# Patient Record
Sex: Female | Born: 1985 | Race: White | Hispanic: No | Marital: Married | Smoking: Current some day smoker
Health system: Southern US, Community
[De-identification: ages and names within clinical notes are randomized; demographics above are authoritative.]

## PROBLEM LIST (undated history)

## (undated) DIAGNOSIS — N200 Calculus of kidney: Secondary | ICD-10-CM

## (undated) DIAGNOSIS — F329 Major depressive disorder, single episode, unspecified: Secondary | ICD-10-CM

## (undated) DIAGNOSIS — F419 Anxiety disorder, unspecified: Secondary | ICD-10-CM

## (undated) DIAGNOSIS — E282 Polycystic ovarian syndrome: Secondary | ICD-10-CM

## (undated) DIAGNOSIS — F32A Depression, unspecified: Secondary | ICD-10-CM

## (undated) HISTORY — DX: Morbid (severe) obesity due to excess calories: E66.01

## (undated) HISTORY — DX: Major depressive disorder, single episode, unspecified: F32.9

## (undated) HISTORY — PX: KIDNEY STONE SURGERY: SHX686

## (undated) HISTORY — DX: Depression, unspecified: F32.A

## (undated) HISTORY — DX: Anxiety disorder, unspecified: F41.9

---
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
## 2007-01-23 ENCOUNTER — Encounter: Payer: Self-pay | Admitting: Family Medicine

## 2007-01-23 DIAGNOSIS — E282 Polycystic ovarian syndrome: Secondary | ICD-10-CM | POA: Insufficient documentation

## 2007-01-23 DIAGNOSIS — F341 Dysthymic disorder: Secondary | ICD-10-CM

## 2007-01-24 ENCOUNTER — Ambulatory Visit: Payer: Self-pay | Admitting: Family Medicine

## 2007-03-29 ENCOUNTER — Ambulatory Visit: Payer: Self-pay | Admitting: Family Medicine

## 2007-05-01 ENCOUNTER — Telehealth: Payer: Self-pay | Admitting: Family Medicine

## 2007-05-11 ENCOUNTER — Ambulatory Visit: Payer: Self-pay | Admitting: Family Medicine

## 2007-06-15 ENCOUNTER — Telehealth (INDEPENDENT_AMBULATORY_CARE_PROVIDER_SITE_OTHER): Payer: Self-pay | Admitting: *Deleted

## 2007-06-17 ENCOUNTER — Inpatient Hospital Stay (HOSPITAL_COMMUNITY): Admission: AD | Admit: 2007-06-17 | Discharge: 2007-06-17 | Payer: Self-pay | Admitting: Gynecology

## 2007-07-24 ENCOUNTER — Ambulatory Visit: Payer: Self-pay | Admitting: Family Medicine

## 2007-07-24 DIAGNOSIS — H6690 Otitis media, unspecified, unspecified ear: Secondary | ICD-10-CM | POA: Insufficient documentation

## 2007-08-25 ENCOUNTER — Telehealth: Payer: Self-pay | Admitting: Family Medicine

## 2007-08-31 ENCOUNTER — Ambulatory Visit: Payer: Self-pay | Admitting: Family Medicine

## 2007-09-01 ENCOUNTER — Telehealth (INDEPENDENT_AMBULATORY_CARE_PROVIDER_SITE_OTHER): Payer: Self-pay | Admitting: *Deleted

## 2007-09-06 ENCOUNTER — Encounter: Admission: RE | Admit: 2007-09-06 | Discharge: 2007-09-06 | Payer: Self-pay | Admitting: Obstetrics & Gynecology

## 2007-09-22 ENCOUNTER — Ambulatory Visit: Payer: Self-pay | Admitting: Family Medicine

## 2007-09-22 DIAGNOSIS — J069 Acute upper respiratory infection, unspecified: Secondary | ICD-10-CM | POA: Insufficient documentation

## 2007-09-22 DIAGNOSIS — R071 Chest pain on breathing: Secondary | ICD-10-CM

## 2007-11-16 ENCOUNTER — Telehealth: Payer: Self-pay | Admitting: Family Medicine

## 2008-06-05 ENCOUNTER — Ambulatory Visit: Payer: Self-pay | Admitting: Family Medicine

## 2008-07-08 ENCOUNTER — Telehealth: Payer: Self-pay | Admitting: Family Medicine

## 2008-07-11 ENCOUNTER — Ambulatory Visit: Payer: Self-pay | Admitting: Family Medicine

## 2008-07-22 ENCOUNTER — Telehealth: Payer: Self-pay | Admitting: Family Medicine

## 2008-07-23 ENCOUNTER — Telehealth: Payer: Self-pay | Admitting: Family Medicine

## 2008-09-18 ENCOUNTER — Telehealth (INDEPENDENT_AMBULATORY_CARE_PROVIDER_SITE_OTHER): Payer: Self-pay | Admitting: *Deleted

## 2008-10-10 ENCOUNTER — Telehealth: Payer: Self-pay | Admitting: Family Medicine

## 2008-11-22 ENCOUNTER — Telehealth: Payer: Self-pay | Admitting: Family Medicine

## 2008-12-06 ENCOUNTER — Telehealth: Payer: Self-pay | Admitting: Family Medicine

## 2008-12-07 ENCOUNTER — Ambulatory Visit: Payer: Self-pay | Admitting: Family Medicine

## 2008-12-27 ENCOUNTER — Telehealth: Payer: Self-pay | Admitting: Family Medicine

## 2009-01-29 ENCOUNTER — Telehealth: Payer: Self-pay | Admitting: Family Medicine

## 2009-02-26 ENCOUNTER — Telehealth: Payer: Self-pay | Admitting: Family Medicine

## 2009-03-18 ENCOUNTER — Telehealth: Payer: Self-pay | Admitting: Family Medicine

## 2009-04-11 ENCOUNTER — Ambulatory Visit: Payer: Self-pay | Admitting: Family Medicine

## 2009-04-11 DIAGNOSIS — M79609 Pain in unspecified limb: Secondary | ICD-10-CM | POA: Insufficient documentation

## 2009-04-14 ENCOUNTER — Telehealth: Payer: Self-pay | Admitting: Family Medicine

## 2009-04-15 ENCOUNTER — Ambulatory Visit: Payer: Self-pay | Admitting: Family Medicine

## 2009-04-17 ENCOUNTER — Telehealth: Payer: Self-pay | Admitting: Family Medicine

## 2009-04-21 ENCOUNTER — Telehealth (INDEPENDENT_AMBULATORY_CARE_PROVIDER_SITE_OTHER): Payer: Self-pay | Admitting: *Deleted

## 2009-04-21 ENCOUNTER — Encounter (INDEPENDENT_AMBULATORY_CARE_PROVIDER_SITE_OTHER): Payer: Self-pay | Admitting: *Deleted

## 2009-04-22 ENCOUNTER — Encounter: Payer: Self-pay | Admitting: Family Medicine

## 2009-04-22 ENCOUNTER — Telehealth: Payer: Self-pay | Admitting: Family Medicine

## 2009-05-13 ENCOUNTER — Ambulatory Visit: Payer: Self-pay | Admitting: Family Medicine

## 2009-05-30 ENCOUNTER — Encounter: Payer: Self-pay | Admitting: Family Medicine

## 2009-06-06 ENCOUNTER — Telehealth: Payer: Self-pay | Admitting: Family Medicine

## 2009-09-07 ENCOUNTER — Emergency Department: Payer: Self-pay | Admitting: Emergency Medicine

## 2009-09-09 ENCOUNTER — Emergency Department: Payer: Self-pay | Admitting: Emergency Medicine

## 2009-09-12 ENCOUNTER — Ambulatory Visit: Payer: Self-pay | Admitting: Urology

## 2009-09-16 ENCOUNTER — Ambulatory Visit: Payer: Self-pay | Admitting: Urology

## 2009-12-11 ENCOUNTER — Encounter: Payer: Self-pay | Admitting: Family Medicine

## 2010-03-19 ENCOUNTER — Ambulatory Visit: Payer: Self-pay | Admitting: Urology

## 2010-11-25 NOTE — Letter (Signed)
Summary: [facility]   Imported By: Lanelle Bal 12/15/2009 14:14:20  _____________________________________________________________________  External Attachment:    Type:   Image     Comment:   External Document

## 2011-04-20 ENCOUNTER — Encounter: Payer: Self-pay | Admitting: Family Medicine

## 2011-04-21 ENCOUNTER — Ambulatory Visit: Payer: Self-pay | Admitting: Family Medicine

## 2011-08-06 LAB — CBC
HCT: 43.7
Hemoglobin: 15.1 — ABNORMAL HIGH
MCHC: 34.7
Platelets: 291
RDW: 12.9

## 2011-08-06 LAB — URINALYSIS, ROUTINE W REFLEX MICROSCOPIC
Bilirubin Urine: NEGATIVE
Glucose, UA: NEGATIVE
Ketones, ur: NEGATIVE
Nitrite: NEGATIVE
Specific Gravity, Urine: >1.03 — ABNORMAL HIGH
pH: 5.5

## 2011-08-06 LAB — WET PREP, GENITAL
Trich, Wet Prep: NONE SEEN
Yeast Wet Prep HPF POC: NONE SEEN

## 2011-08-06 LAB — SAMPLE TO BLOOD BANK

## 2011-08-06 LAB — GC/CHLAMYDIA PROBE AMP, GENITAL: Chlamydia, DNA Probe: NEGATIVE

## 2011-08-06 LAB — POCT PREGNANCY, URINE: Operator id: 140111

## 2011-08-06 LAB — URINE MICROSCOPIC-ADD ON

## 2012-03-14 ENCOUNTER — Ambulatory Visit (INDEPENDENT_AMBULATORY_CARE_PROVIDER_SITE_OTHER): Payer: BC Managed Care – PPO | Admitting: Family Medicine

## 2012-03-14 ENCOUNTER — Encounter: Payer: Self-pay | Admitting: Family Medicine

## 2012-03-14 VITALS — BP 104/80 | HR 60 | Temp 98.1°F | Wt 245.0 lb

## 2012-03-14 DIAGNOSIS — J029 Acute pharyngitis, unspecified: Secondary | ICD-10-CM

## 2012-03-14 NOTE — Progress Notes (Signed)
Addended by: Eliezer Bottom on: 03/14/2012 09:38 AM   Modules accepted: Orders

## 2012-03-14 NOTE — Progress Notes (Signed)
SUBJECTIVE: 26 y.o. female with sore throat, myalgias, swollen glands, headache and fever for 7 days.  Friends dad was diagnosed with strep throat.  No history of rheumatic fever. Other symptoms: congestion.  Patient Active Problem List  Diagnoses  . POLYCYSTIC OVARIAN DISEASE  . ANXIETY DEPRESSION  . LOM  . URI  . ASTHMA, INTERMITTENT, MILD  . FOOT PAIN, LEFT  . CHEST WALL PAIN, ACUTE  . ALLERGY   Past Medical History  Diagnosis Date  . Anxiety   . Depression   . Morbid obesity    No past surgical history on file. History  Substance Use Topics  . Smoking status: Never Smoker   . Smokeless tobacco: Not on file  . Alcohol Use: Not on file   No family history on file. Allergies  Allergen Reactions  . Codeine Phosphate     REACTION: questionable  . Erythromycin Ethylsuccinate     REACTION: questionable   Current Outpatient Prescriptions on File Prior to Visit  Medication Sig Dispense Refill  . albuterol (PROVENTIL) 90 MCG/ACT inhaler Inhale 2 puffs into the lungs every 6 (six) hours as needed.        Marland Kitchen ibuprofen (ADVIL,MOTRIN) 200 MG tablet Take 200 mg by mouth. As needed for pain or fever      . norethindrone-ethinyl estradiol (LOESTRIN 1/20, 21,) 1-20 MG-MCG per tablet Take 1 tablet by mouth daily.         The PMH, PSH, Social History, Family History, Medications, and allergies have been reviewed in [facility], and have been updated if relevant.  OBJECTIVE:  BP 104/80  Pulse 60  Temp(Src) 98.1 F (36.7 C) (Oral)  Wt 245 lb (111.131 kg)  Vitals as noted above. Appears alert, well appearing, and in no distress. Ears: bilateral TM's and external ear canals normal Oropharynx: erythematous Neck: supple, no significant adenopathy Lungs: clear to auscultation, no wheezes, rales or rhonchi, symmetric air entry Rapid Strep test is present  ASSESSMENT: Viral pharyngitis  PLAN: . Gargle, use acetaminophen or other OTC analgesic. Send throat swab for cx.

## 2012-07-30 ENCOUNTER — Encounter (HOSPITAL_COMMUNITY): Payer: Self-pay | Admitting: *Deleted

## 2012-07-30 ENCOUNTER — Emergency Department (HOSPITAL_COMMUNITY)
Admission: EM | Admit: 2012-07-30 | Discharge: 2012-07-30 | Disposition: A | Discharge status: Home / Self Care | Payer: BC Managed Care – PPO | Attending: Emergency Medicine | Admitting: Emergency Medicine

## 2012-07-30 DIAGNOSIS — K5289 Other specified noninfective gastroenteritis and colitis: Secondary | ICD-10-CM | POA: Insufficient documentation

## 2012-07-30 DIAGNOSIS — K529 Noninfective gastroenteritis and colitis, unspecified: Secondary | ICD-10-CM

## 2012-07-30 DIAGNOSIS — E669 Obesity, unspecified: Secondary | ICD-10-CM | POA: Insufficient documentation

## 2012-07-30 DIAGNOSIS — D72829 Elevated white blood cell count, unspecified: Secondary | ICD-10-CM | POA: Insufficient documentation

## 2012-07-30 DIAGNOSIS — R197 Diarrhea, unspecified: Secondary | ICD-10-CM

## 2012-07-30 HISTORY — DX: Calculus of kidney: N20.0

## 2012-07-30 HISTORY — DX: Polycystic ovarian syndrome: E28.2

## 2012-07-30 LAB — CBC
HCT: 44.9 % (ref 36.0–46.0)
Hemoglobin: 16.2 g/dL — ABNORMAL HIGH (ref 12.0–15.0)
Platelets: 246 10*3/uL (ref 150–400)
RDW: 13.4 % (ref 11.5–15.5)

## 2012-07-30 LAB — URINALYSIS, ROUTINE W REFLEX MICROSCOPIC
Bilirubin Urine: NEGATIVE
Glucose, UA: NEGATIVE mg/dL
Hgb urine dipstick: NEGATIVE
Ketones, ur: NEGATIVE mg/dL
Nitrite: NEGATIVE
Specific Gravity, Urine: 1.024 (ref 1.005–1.030)
Urobilinogen, UA: 0.2 mg/dL (ref 0.0–1.0)

## 2012-07-30 LAB — COMPREHENSIVE METABOLIC PANEL
AST: 15 U/L (ref 0–37)
Albumin: 4.4 g/dL (ref 3.5–5.2)
CO2: 23 mEq/L (ref 19–32)
Calcium: 9.4 mg/dL (ref 8.4–10.5)
Creatinine, Ser: 0.74 mg/dL (ref 0.50–1.10)
Glucose, Bld: 105 mg/dL — ABNORMAL HIGH (ref 70–99)
Potassium: 3.4 mEq/L — ABNORMAL LOW (ref 3.5–5.1)
Sodium: 138 mEq/L (ref 135–145)
Total Bilirubin: 0.5 mg/dL (ref 0.3–1.2)
Total Protein: 8.1 g/dL (ref 6.0–8.3)

## 2012-07-30 MED ORDER — SODIUM CHLORIDE 0.9 % IV BOLUS (SEPSIS)
1000.0000 mL | Freq: Once | INTRAVENOUS | Status: AC
  Administered 2012-07-30: 1000 mL via INTRAVENOUS

## 2012-07-30 MED ORDER — ONDANSETRON HCL 4 MG/2ML IJ SOLN
4.0000 mg | Freq: Once | INTRAMUSCULAR | Status: AC
  Administered 2012-07-30: 4 mg via INTRAVENOUS

## 2012-07-30 MED ORDER — ONDANSETRON HCL 4 MG/2ML IJ SOLN
4.0000 mg | Freq: Once | INTRAMUSCULAR | Status: AC
  Administered 2012-07-30: 4 mg via INTRAVENOUS
  Filled 2012-07-30: qty 2

## 2012-07-30 MED ORDER — ONDANSETRON HCL 4 MG/2ML IJ SOLN
INTRAMUSCULAR | Status: AC
  Administered 2012-07-30: 4 mg via INTRAVENOUS
  Filled 2012-07-30: qty 2

## 2012-07-30 MED ORDER — ONDANSETRON HCL 4 MG PO TABS: 4.0000 mg | ORAL_TABLET | Freq: Four times a day (QID) | ORAL | Status: DC

## 2012-07-30 MED ORDER — FENTANYL CITRATE 0.05 MG/ML IJ SOLN
50.0000 ug | Freq: Once | INTRAMUSCULAR | Status: AC
  Administered 2012-07-30: 50 ug via INTRAVENOUS
  Filled 2012-07-30: qty 2

## 2012-07-30 NOTE — ED Provider Notes (Cosign Needed)
History     CSN: [id number]  Arrival date & time 07/30/12  0345   First MD Initiated Contact with Patient 07/30/12 0430      Chief Complaint  Patient presents with  . Nausea  . Emesis    (Consider location/radiation/quality/duration/timing/severity/associated sxs/prior treatment) HPI 26 year old female presents to the emergency department with complaint of one day of nausea, dizziness. She reports she woke yesterday morning at 5 AM feeling nauseous and dizzy. [place]. She was able to eat lunch, but did not feel well during the day. She slept on the way back from the mountains and had a 3 hour nap upon arriving home. She had a peanut butter sandwich around 9:30. At 10:30 she vomited, reports she's forced her self vomit because her stomach wouldn't stop churning. She woke at 3 AM with acute perfuse nausea vomiting and diarrhea. She denies any fevers. She denies any abdominal pain aside from some muscle soreness from vomiting. She's had no urinary symptoms. No sick contacts, no unusual foods. No recent exotic travel  Past Medical History  Diagnosis Date  . Anxiety   . Depression   . Morbid obesity   . Polycystic ovarian disease   . Kidney stone     Past Surgical History  Procedure Date  . Kidney stone surgery     No family history on file.  History  Substance Use Topics  . Smoking status: Never Smoker   . Smokeless tobacco: Not on file  . Alcohol Use: Yes     occ    OB History    Grav Para Term Preterm Abortions TAB SAB Ect Mult Living                  Review of Systems  All other systems reviewed and are negative.    Allergies  Codeine phosphate and Erythromycin ethylsuccinate  Home Medications   Current Outpatient Rx  Name Route Sig Dispense Refill  . IBUPROFEN 200 MG PO TABS Oral Take 400-800 mg by mouth every 6 (six) hours as needed. As needed for pain or fever    . ALBUTEROL 90 MCG/ACT IN AERS Inhalation Inhale 2  puffs into the lungs every 6 (six) hours as needed.        BP 132/82  Pulse 109  Temp 98 F (36.7 C)  Resp 20  SpO2 98%  LMP 07/27/2012  Physical Exam  Nursing note and vitals reviewed. Constitutional: She is oriented to person, place, and time. She appears well-developed and well-nourished.       Obese, uncomfortable.  HENT:  Head: Normocephalic and atraumatic.  Nose: Nose normal.  Mouth/Throat: Oropharynx is clear and moist.  Eyes: Conjunctivae normal and EOM are normal. Pupils are equal, round, and reactive to light.  Neck: Normal range of motion. Neck supple. No JVD present. No tracheal deviation present. No thyromegaly present.  Cardiovascular: Regular rhythm, normal heart sounds and intact distal pulses.  Exam reveals no gallop and no friction rub.   No murmur heard.      Mild tachycardia noted  Pulmonary/Chest: Effort normal and breath sounds normal. No stridor. No respiratory distress. She has no wheezes. She has no rales. She exhibits no tenderness.  Abdominal: Soft. She exhibits no distension and no mass. There is no tenderness. There is no rebound and no guarding.       Hyperactive bowel sounds. Abdomen is nontender, but pushing on abdomen causes increased nausea  Musculoskeletal: Normal  range of motion. She exhibits no edema and no tenderness.  Lymphadenopathy:    She has no cervical adenopathy.  Neurological: She is alert and oriented to person, place, and time. She exhibits normal muscle tone. Coordination normal.  Skin: Skin is warm and dry. No rash noted. No erythema. No pallor.  Psychiatric: She has a normal mood and affect. Her behavior is normal. Judgment and thought content normal.    ED Course  Procedures (including critical care time)  Labs Reviewed  CBC - Abnormal; Notable for the following:    WBC 21.0 (*)     RBC 5.41 (*)     Hemoglobin 16.2 (*)     MCHC 36.1 (*)     All other components within normal limits  COMPREHENSIVE METABOLIC PANEL -  Abnormal; Notable for the following:    Potassium 3.4 (*)     Glucose, Bld 105 (*)     All other components within normal limits  URINALYSIS, ROUTINE W REFLEX MICROSCOPIC  PREGNANCY, URINE   No results found.   1. Nausea vomiting and diarrhea   2. Leukocytosis       MDM  26 year old female with nausea vomiting and diarrhea since 3 AM with malaise and the day prior. No fever here, initial tachycardia resolved with fluids. Patient is noted to have significant leukocytosis, suspect the marginal ulceration from vomiting. Abdomen rechecked after an hour, still nonsurgical without rebound or guarding or pain. Patient reports she felt very dizzy with standing. Will continue IV fluids, serial abdominal exams. She has not yet been able to urinate await results.        [name], MD 07/30/12 [phone]  7:23 AM Care passed to Dr Effie Shy awaiting improvement in nausea, dizziness.    [name], MD 07/30/12 [phone]

## 2012-07-30 NOTE — ED Notes (Signed)
Pt c/o feeling dizzy; weak; vomiting started at 2130

## 2012-07-30 NOTE — ED Notes (Signed)
Patient tolerating PO liquids and crackers.

## 2012-10-26 ENCOUNTER — Encounter: Payer: Self-pay | Admitting: Family Medicine

## 2012-10-26 ENCOUNTER — Ambulatory Visit (INDEPENDENT_AMBULATORY_CARE_PROVIDER_SITE_OTHER): Payer: BC Managed Care – PPO | Admitting: Family Medicine

## 2012-10-26 VITALS — BP 110/70 | HR 110 | Temp 98.9°F | Ht 63.0 in | Wt 231.0 lb

## 2012-10-26 DIAGNOSIS — J111 Influenza due to unidentified influenza virus with other respiratory manifestations: Secondary | ICD-10-CM

## 2012-10-26 DIAGNOSIS — A084 Viral intestinal infection, unspecified: Secondary | ICD-10-CM | POA: Insufficient documentation

## 2012-10-26 MED ORDER — ONDANSETRON HCL 4 MG PO TABS: 4.0000 mg | ORAL_TABLET | Freq: Four times a day (QID) | ORAL | Status: DC

## 2012-10-26 NOTE — Progress Notes (Signed)
  Subjective:    Patient ID: Jamie Mcknight, female    DOB: [birth year], 27 y.o.   MRN: [id number]  HPI Comments: Exposed to flu from father  Has been drinking gatorade today. No emesis today.  Fever  This is a new problem. The current episode started yesterday (sudden onset). Maximum temperature: subjective temp. Associated symptoms include headaches, nausea and vomiting. Pertinent negatives include no congestion, coughing, diarrhea, ear pain, sore throat or wheezing. Treatments tried: zofran. The treatment provided mild relief.      Review of Systems  Constitutional: Positive for fever.  HENT: Negative for ear pain, congestion and sore throat.   Respiratory: Negative for cough and wheezing.   Gastrointestinal: Positive for nausea and vomiting. Negative for diarrhea.  Neurological: Positive for headaches.       Objective:   Physical Exam  Constitutional: Vital signs are normal. She appears well-developed and well-nourished. She is cooperative.  Non-toxic appearance. She does not appear ill. No distress.       Fatigued appearing  HENT:  Head: Normocephalic.  Right Ear: Hearing, tympanic membrane, external ear and ear canal normal. Tympanic membrane is not erythematous, not retracted and not bulging.  Left Ear: Hearing, tympanic membrane, external ear and ear canal normal. Tympanic membrane is not erythematous, not retracted and not bulging.  Nose: No mucosal edema or rhinorrhea. Right sinus exhibits no maxillary sinus tenderness and no frontal sinus tenderness. Left sinus exhibits no maxillary sinus tenderness and no frontal sinus tenderness.  Mouth/Throat: Uvula is midline, oropharynx is clear and moist and mucous membranes are normal.  Eyes: Conjunctivae normal, EOM and lids are normal. Pupils are equal, round, and reactive to light. No foreign bodies found.  Neck: Trachea normal and normal range of motion. Neck supple. Carotid bruit is not present. No mass and no thyromegaly present.    Cardiovascular: Normal rate, regular rhythm, S1 normal, S2 normal, normal heart sounds, intact distal pulses and normal pulses.  Exam reveals no gallop and no friction rub.   No murmur heard. Pulmonary/Chest: Effort normal and breath sounds normal. Not tachypneic. No respiratory distress. She has no decreased breath sounds. She has no wheezes. She has no rhonchi. She has no rales.  Abdominal: Soft. Normal appearance and bowel sounds are normal. There is no tenderness.  Neurological: She is alert.  Skin: Skin is warm, dry and intact. No rash noted.  Psychiatric: Her speech is normal and behavior is normal. Judgment normal. Her mood appears not anxious. Cognition and memory are normal. She does not exhibit a depressed mood.          Assessment & Plan:

## 2012-10-26 NOTE — Patient Instructions (Addendum)
Start tylenol for pain and fever. Push fluids. Rest. Stay out out work until 24 hours after fever resolved off tylenol.  Call if you change your mind about tamiflu afer talking with your Dad.

## 2012-10-26 NOTE — Assessment & Plan Note (Addendum)
Discussed symptomatic care.  Hydration, rest. Call if SOB, cough worsening or prolongued fever.  Given father with flu she possibly has a variant of this. Reviewed flu timeline. She has no health problems that put her at risk for complications , so we decided against use of tamiflu. Pt agreed. Discussed family prophylaxis, her roomate will consider tamiflu prophylaxis. She was advised to not return to work until 24 hour after fever resolved on no antipyretics.

## 2014-11-27 ENCOUNTER — Other Ambulatory Visit: Payer: Self-pay | Admitting: Internal Medicine

## 2014-11-27 DIAGNOSIS — M25562 Pain in left knee: Secondary | ICD-10-CM

## 2014-12-02 ENCOUNTER — Other Ambulatory Visit: Payer: Self-pay

## 2014-12-04 ENCOUNTER — Ambulatory Visit
Admission: RE | Admit: 2014-12-04 | Discharge: 2014-12-04 | Disposition: A | Discharge status: Home / Self Care | Payer: No Typology Code available for payment source | Source: Ambulatory Visit | Attending: Internal Medicine | Admitting: Internal Medicine

## 2014-12-04 DIAGNOSIS — M25562 Pain in left knee: Secondary | ICD-10-CM

## 2016-10-29 ENCOUNTER — Emergency Department (HOSPITAL_COMMUNITY)
Admission: EM | Admit: 2016-10-29 | Discharge: 2016-10-29 | Disposition: A | Discharge status: Home / Self Care | Payer: BC Managed Care – PPO | Attending: Emergency Medicine | Admitting: Emergency Medicine

## 2016-10-29 ENCOUNTER — Encounter (HOSPITAL_COMMUNITY): Payer: Self-pay

## 2016-10-29 DIAGNOSIS — R69 Illness, unspecified: Secondary | ICD-10-CM

## 2016-10-29 DIAGNOSIS — J111 Influenza due to unidentified influenza virus with other respiratory manifestations: Secondary | ICD-10-CM | POA: Insufficient documentation

## 2016-10-29 DIAGNOSIS — R112 Nausea with vomiting, unspecified: Secondary | ICD-10-CM | POA: Diagnosis present

## 2016-10-29 LAB — CBC
HCT: 43.1 % (ref 36.0–46.0)
Hemoglobin: 14.6 g/dL (ref 12.0–15.0)
MCH: 27.8 pg (ref 26.0–34.0)
MCHC: 33.9 g/dL (ref 30.0–36.0)
MCV: 82.1 fL (ref 78.0–100.0)
PLATELETS: 257 10*3/uL (ref 150–400)
RBC: 5.25 MIL/uL — ABNORMAL HIGH (ref 3.87–5.11)
RDW: 13.9 % (ref 11.5–15.5)
WBC: 17.1 10*3/uL — AB (ref 4.0–10.5)

## 2016-10-29 LAB — COMPREHENSIVE METABOLIC PANEL
ALT: 17 U/L (ref 14–54)
ANION GAP: 13 (ref 5–15)
AST: 23 U/L (ref 15–41)
Albumin: 4.3 g/dL (ref 3.5–5.0)
Alkaline Phosphatase: 97 U/L (ref 38–126)
BUN: 18 mg/dL (ref 6–20)
CALCIUM: 9.2 mg/dL (ref 8.9–10.3)
CHLORIDE: 106 mmol/L (ref 101–111)
CO2: 20 mmol/L — ABNORMAL LOW (ref 22–32)
CREATININE: 0.85 mg/dL (ref 0.44–1.00)
Glucose, Bld: 148 mg/dL — ABNORMAL HIGH (ref 65–99)
Potassium: 3.7 mmol/L (ref 3.5–5.1)
Sodium: 139 mmol/L (ref 135–145)
Total Bilirubin: 0.3 mg/dL (ref 0.3–1.2)
Total Protein: 8 g/dL (ref 6.5–8.1)

## 2016-10-29 LAB — I-STAT BETA HCG BLOOD, ED (MC, WL, AP ONLY)

## 2016-10-29 LAB — LIPASE, BLOOD: LIPASE: 20 U/L (ref 11–51)

## 2016-10-29 MED ORDER — SODIUM CHLORIDE 0.9 % IV BOLUS (SEPSIS)
1000.0000 mL | Freq: Once | INTRAVENOUS | Status: AC
  Administered 2016-10-29: 1000 mL via INTRAVENOUS

## 2016-10-29 MED ORDER — ONDANSETRON 4 MG PO TBDP: 4.0000 mg | ORAL_TABLET | Freq: Three times a day (TID) | ORAL | 0 refills | Status: DC | PRN

## 2016-10-29 MED ORDER — KETOROLAC TROMETHAMINE 30 MG/ML IJ SOLN
30.0000 mg | Freq: Once | INTRAMUSCULAR | Status: AC
  Administered 2016-10-29: 30 mg via INTRAVENOUS
  Filled 2016-10-29: qty 1

## 2016-10-29 MED ORDER — ONDANSETRON HCL 4 MG/2ML IJ SOLN
4.0000 mg | Freq: Once | INTRAMUSCULAR | Status: AC
  Administered 2016-10-29: 4 mg via INTRAVENOUS
  Filled 2016-10-29: qty 2

## 2016-10-29 MED ORDER — SODIUM CHLORIDE 0.9 % IV SOLN
Freq: Once | INTRAVENOUS | Status: AC
  Administered 2016-10-29: 09:00:00 via INTRAVENOUS

## 2016-10-29 NOTE — ED Notes (Signed)
Verbalized understanding discharge instructions, prescriptions, and follow-up. In no acute distress.  Work noted provided.

## 2016-10-29 NOTE — Discharge Instructions (Signed)
Your symptoms are consistent with a viral illness. Viruses do not require antibiotics. Treatment is symptomatic care and it is important to note that these symptoms may last for 7-10 days. Drink plenty of fluids and get plenty of rest. You should be drinking at least half a liter of water an hour to stay hydrated. Electrolyte drinks are also encouraged. Ibuprofen, Naproxen, or Tylenol for pain or fever. Zofran for nausea. Warm liquids or Chloraseptic spray may help soothe a sore throat. Follow up with a primary care provider, as needed, for any future management of this issue. °

## 2016-10-29 NOTE — ED Notes (Signed)
Pt reports that she is feeling much better.  Sts "I think, the remaining pain is just typical 'getting ready to start my period' pain."

## 2016-10-29 NOTE — ED Provider Notes (Signed)
[facility]-EMERGENCY DEPT Provider Note   CSN: [id number] Arrival date & time: 10/29/16  0810     History   Chief Complaint Chief Complaint  Patient presents with  . Back Pain  . Emesis  . Diarrhea    HPI Jamie Mcknight is a 31 y.o. female.  HPI   Jamie Mcknight is a 31 y.o. female, with a history of obesity and PCOS, presenting to the ED with nausea, vomiting, diarrhea, and body aches beginning around 12 am this morning. Pt also complains of lower back pain starting after the vomiting that she states may be from bending over to vomit. 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 Patient has not tried any medications for her symptoms. Pt denies hematochezia/melena, fever/chills, abnormal vaginal discharge, or any other complaints.    Past Medical History:  Diagnosis Date  . Anxiety   . Depression   . Kidney stone   . Morbid obesity (HCC)   . Polycystic ovarian disease     Patient Active Problem List   Diagnosis Date Noted  . Viral gastroenteritis 10/26/2012  . POLYCYSTIC OVARIAN DISEASE 01/23/2007  . ANXIETY DEPRESSION 01/23/2007  . ASTHMA, INTERMITTENT, MILD 10/25/1996  . ALLERGY 10/25/1996    Past Surgical History:  Procedure Laterality Date  . KIDNEY STONE SURGERY      OB History    No data available       Home Medications    Prior to Admission medications   Medication Sig Start Date End Date Taking? Authorizing Provider  Multiple Vitamins-Minerals (MULTIVITAMIN ADULTS PO) Take 1 tablet by mouth daily.   Yes Historical Provider, MD  ondansetron (ZOFRAN ODT) 4 MG disintegrating tablet Take 1 tablet (4 mg total) by mouth every 8 (eight) hours as needed for nausea or vomiting. 10/29/16   Anselm PancoastShawn C Joy, PA-C    Family History History reviewed. No pertinent family history.  Social History Social History  Substance Use Topics  . Smoking status: Never Smoker  . Smokeless tobacco: Never Used  . Alcohol use Yes     Comment: occ     Allergies   Codeine  phosphate and Erythromycin ethylsuccinate   Review of Systems Review of Systems  Constitutional: Positive for chills. Negative for fever.  Respiratory: Negative for cough and shortness of breath.   Cardiovascular: Negative for chest pain.  Gastrointestinal: Positive for diarrhea, nausea and vomiting. Negative for abdominal pain.  Musculoskeletal: Positive for myalgias.  All other systems reviewed and are negative.    Physical Exam Updated Vital Signs BP 129/76 (BP Location: Left Arm)   Pulse 105   Temp 99.6 F (37.6 C) (Oral)   Resp 18   Ht 5\' 4"  (1.626 m)   Wt 117.9 kg   LMP 10/08/2016   SpO2 96%   BMI 44.63 kg/m   Physical Exam  Constitutional: She appears well-developed and well-nourished. No distress.  HENT:  Head: Normocephalic and atraumatic.  Eyes: Conjunctivae are normal.  Neck: Neck supple.  Cardiovascular: Normal rate, regular rhythm, normal heart sounds and intact distal pulses.   Pulmonary/Chest: Effort normal and breath sounds normal. No respiratory distress.  Abdominal: Soft. There is no tenderness. There is no guarding.  Musculoskeletal: She exhibits tenderness. She exhibits no edema.  Tenderness to the bilateral lumbar musculature.  Lymphadenopathy:    She has no cervical adenopathy.  Neurological: She is alert.  Skin: Skin is warm and dry. She is not diaphoretic.  Psychiatric: She has a normal mood and affect.  Her behavior is normal.  Nursing note and vitals reviewed.    ED Treatments / Results  Labs (all labs ordered are listed, but only abnormal results are displayed) Labs Reviewed  COMPREHENSIVE METABOLIC PANEL - Abnormal; Notable for the following:       Result Value   CO2 20 (*)    Glucose, Bld 148 (*)    All other components within normal limits  CBC - Abnormal; Notable for the following:    WBC 17.1 (*)    RBC 5.25 (*)    All other components within normal limits  LIPASE, BLOOD  URINALYSIS, ROUTINE W REFLEX MICROSCOPIC  I-STAT  BETA HCG BLOOD, ED ([facility], [facility], [facility] ONLY)    EKG  EKG Interpretation None       Radiology No results found.  Procedures Procedures (including critical care time)  Medications Ordered in ED Medications  sodium chloride 0.9 % bolus 1,000 mL (0 mLs Intravenous Stopped 10/29/16 1057)  0.9 %  sodium chloride infusion ( Intravenous New Bag/Given 10/29/16 0903)  ondansetron (ZOFRAN) injection 4 mg (4 mg Intravenous Given 10/29/16 0910)  ketorolac (TORADOL) 30 MG/ML injection 30 mg (30 mg Intravenous Given 10/29/16 0910)     Initial Impression / Assessment and Plan / ED Course  I have reviewed the triage vital signs and the nursing notes.  Pertinent labs & imaging results that were available during my care of the patient were reviewed by me and considered in my medical decision making (see chart for details).  Clinical Course     Patient presents with influenza-like symptoms. Nontoxic appearing. Doubt sepsis. Upon reassessment, patient states that she feels much better. No vomiting or diarrhea here in the ED. Able to pass an oral fluid challenge without difficulty. The patient was given instructions for home care as well as return precautions. Patient voices understanding of these instructions, accepts the plan, and is comfortable with discharge.   Vitals:   10/29/16 0810 10/29/16 0814 10/29/16 0816  BP:  129/76   Pulse:  105   Resp:  18   Temp:  99.6 F (37.6 C)   TempSrc:  Oral   SpO2: 97% 96%   Weight:   117.9 kg  Height:   5\' 4"  (1.626 m)     Final Clinical Impressions(s) / ED Diagnoses   Final diagnoses:  Influenza-like illness    New Prescriptions New Prescriptions   ONDANSETRON (ZOFRAN ODT) 4 MG DISINTEGRATING TABLET    Take 1 tablet (4 mg total) by mouth every 8 (eight) hours as needed for nausea or vomiting.     Anselm Pancoast, PA-C 10/29/16 1101    Melene Plan, DO 10/29/16 1106

## 2016-10-29 NOTE — ED Notes (Signed)
Pt given ginger ale.

## 2016-10-29 NOTE — ED Triage Notes (Addendum)
Per EMS, Pt, from home, c/o lower back pain and n/v/d starting last night.  Pain score 7/10.  Pt has not taken anything for symptoms.  Pt denies urinary complaints.  Sts "I peed this morning and everything was normal."

## 2016-10-29 NOTE — ED Notes (Signed)
Bed: [id number] Expected date:  Expected time:  Means of arrival: Ambulance Comments: EMS 31 yo F n/v

## 2017-05-05 ENCOUNTER — Other Ambulatory Visit (HOSPITAL_COMMUNITY): Payer: Self-pay | Admitting: General Surgery

## 2017-05-23 ENCOUNTER — Encounter: Payer: BC Managed Care – PPO | Attending: General Surgery | Admitting: Registered"

## 2017-05-23 ENCOUNTER — Encounter: Payer: Self-pay | Admitting: Registered"

## 2017-05-23 DIAGNOSIS — Z6841 Body Mass Index (BMI) 40.0 and over, adult: Secondary | ICD-10-CM | POA: Diagnosis not present

## 2017-05-23 DIAGNOSIS — F329 Major depressive disorder, single episode, unspecified: Secondary | ICD-10-CM | POA: Insufficient documentation

## 2017-05-23 DIAGNOSIS — Z713 Dietary counseling and surveillance: Secondary | ICD-10-CM | POA: Diagnosis not present

## 2017-05-23 DIAGNOSIS — J45909 Unspecified asthma, uncomplicated: Secondary | ICD-10-CM | POA: Insufficient documentation

## 2017-05-23 DIAGNOSIS — F419 Anxiety disorder, unspecified: Secondary | ICD-10-CM | POA: Insufficient documentation

## 2017-05-23 DIAGNOSIS — E669 Obesity, unspecified: Secondary | ICD-10-CM

## 2017-05-23 DIAGNOSIS — Z79899 Other long term (current) drug therapy: Secondary | ICD-10-CM | POA: Insufficient documentation

## 2017-05-23 NOTE — Progress Notes (Signed)
Pre-Op Assessment Visit:  Pre-Operative RYGB Surgery  Medical Nutrition Therapy:  Appt start time: 3:30  End time:  4:35  Patient was seen on 05/23/2017 for Pre-Operative Nutrition Assessment. [facility] on 05/23/2017.   Pt expectation of surgery: "to have a better and healthier life, help get PCOS under control, ability to walk up stairs without getting winded, buying cut clothes in the store"  Pt expectation of Dietitian: positive support especially afterwards  Start weight at [facility]: 269.7 BMI: 45.93   Pt states she surgery date is scheduled for 9/24. Pt states she is getting married 11/4. Pt states she began taking birth control at age of 31 causing weight gain and has not been able to lose weight since then. Pt states she usually does not eat yogurt. 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  Per insurance, pt needs 0 SWL visits prior to surgery.    24 hr Dietary Recall: First Meal: fruit  Snack: 2 slices of [place] bacon, tomatoes or 1/2 peanut butter and banana sandwich, 6 carrots Second Meal: baked chicken, salad (with blueberries, strawberries, cucumbers, vinaigrette) Snack: leftovers from 1st snack Third Meal: meat, vegetable Snack: none Beverages: water, unsweetened tea, black coffee  Encouraged to engage in 150 minutes of moderate physical activity including cardiovascular and weight baring weekly  Handouts given during visit include:  . Pre-Op Goals . Bariatric Surgery Protein Shakes . Vitamin and Mineral Recommendations  During the appointment today the following Pre-Op Goals were reviewed with the patient: . Maintain or lose weight as instructed by your surgeon . Make healthy food choices . Begin to limit portion sizes . Limited concentrated sugars and fried foods . Keep fat/sugar in the single digits per serving on          food  labels . Practice CHEWING your food  (aim for 30 chews per bite or until applesauce consistency) . Practice not drinking 15 minutes before, during, and 30 minutes after each meal/snack . Avoid alcohol . Avoid all carbonated beverages  . Avoid/limit caffeinated beverages  . Avoid all sugar-sweetened beverages . Consume 3 meals per day; eat every 3-5 hours . Make a list of non-food related activities . Aim for 64-100 ounces of FLUID daily  . Aim for at least 60-80 grams of PROTEIN daily . Look for a liquid protein source that contain ?15 g protein and ?5 g carbohydrate  (ex: shakes, drinks, shots) . Physical activity is an important part of a healthy lifestyle so keep it moving!  Follow diet recommendations listed below Energy and Macronutrient Recommendations: Calories: 1600 Carbohydrate: 180 Protein: 120 Fat: 44  Demonstrated degree of understanding via:  Teach Back   Teaching Method Utilized:  Visual Auditory Hands on  Barriers to learning/adherence to lifestyle change: none  Patient to call the Nutrition and Diabetes Education Services to enroll in Pre-Op and Post-Op Nutrition Education when surgery date is scheduled.

## 2017-05-27 ENCOUNTER — Other Ambulatory Visit: Payer: Self-pay

## 2017-05-27 ENCOUNTER — Ambulatory Visit (HOSPITAL_COMMUNITY)
Admission: RE | Admit: 2017-05-27 | Discharge: 2017-05-27 | Disposition: A | Discharge status: Home / Self Care | Payer: BC Managed Care – PPO | Source: Ambulatory Visit | Attending: General Surgery | Admitting: General Surgery

## 2017-05-27 DIAGNOSIS — Z01818 Encounter for other preprocedural examination: Secondary | ICD-10-CM | POA: Diagnosis present

## 2017-05-30 ENCOUNTER — Telehealth: Payer: Self-pay | Admitting: *Deleted

## 2017-05-30 NOTE — Telephone Encounter (Signed)
Pt called and left message in triage c/o issues with period while on birth control pills. Left message for pt to call.

## 2017-06-13 ENCOUNTER — Encounter: Payer: Self-pay | Admitting: Obstetrics & Gynecology

## 2017-06-13 ENCOUNTER — Ambulatory Visit (INDEPENDENT_AMBULATORY_CARE_PROVIDER_SITE_OTHER): Payer: BC Managed Care – PPO | Admitting: Obstetrics & Gynecology

## 2017-06-13 VITALS — BP 132/80

## 2017-06-13 DIAGNOSIS — Z3009 Encounter for other general counseling and advice on contraception: Secondary | ICD-10-CM

## 2017-06-13 NOTE — Progress Notes (Signed)
    Jamie Mcknight 08/23/1986 [id number]        31 y.o.  G0  Engaged.  Getting married 08/2017.  RP:  Contraception counseling  HPI:  Jamie Mcknight and heavy menses on BCPs.  Tried Apri continuous use without success, BTB.  No pelvic pain.  No abnormal vaginal d/c.  Obesity, Bariatric surgery 07/18/2017.  Wedding 08/2017.  Past medical history,surgical history, problem list, medications, allergies, family history and social history were all reviewed and documented in the EPIC chart.  Directed ROS with pertinent positives and negatives documented in the history of present illness/assessment and plan.  Exam:  There were no vitals filed for this visit. General appearance:  Normal  Full physical/Gyn exam done 01/12/2017 by myself at [facility]  Assessment/Plan:  31 y.o.   1. Encounter for other general counseling or advice on contraception Contraception counseling done.  Declines Estrogen-Progestin methods.  Declines DepoProvera injections.  Nexplanon and Mirena IUD information and pamphlets given.  Decision to proceed with Nexplanon.  F/U Nexplanon insertion.  Counseling >50% x 15 minutes.  Genia Del MD, 4:15 PM 06/13/2017

## 2017-06-17 ENCOUNTER — Telehealth: Payer: Self-pay | Admitting: *Deleted

## 2017-06-17 NOTE — Telephone Encounter (Signed)
Pt called will have Bariatric surgery 07/18/2017, spoke with you about Nexplanon at [facility] 06/13/17.  Her only concern with Nexplanon  is when she loses weight if the implant will move due to lost skin in her arm?  Please advise

## 2017-06-17 NOTE — Telephone Encounter (Signed)
Left detailed message per DPR access.  

## 2017-06-17 NOTE — Telephone Encounter (Signed)
I will insert it very superficially close to the skin, so should stay superficial after weight loss, not a problem.

## 2017-06-19 NOTE — Patient Instructions (Signed)
1. Encounter for other general counseling or advice on contraception Contraception counseling done.  Declines Estrogen-Progestin methods.  Declines DepoProvera injections.  Nexplanon and Mirena IUD information and pamphlets given.  Decision to proceed with Nexplanon.  F/U Nexplanon insertion.  Saranne, it was a pleasure to see you today!  I will see you again soon.

## 2017-07-04 ENCOUNTER — Encounter: Payer: BC Managed Care – PPO | Attending: General Surgery | Admitting: Skilled Nursing Facility1

## 2017-07-04 DIAGNOSIS — Z713 Dietary counseling and surveillance: Secondary | ICD-10-CM | POA: Insufficient documentation

## 2017-07-04 DIAGNOSIS — F329 Major depressive disorder, single episode, unspecified: Secondary | ICD-10-CM | POA: Insufficient documentation

## 2017-07-04 DIAGNOSIS — Z79899 Other long term (current) drug therapy: Secondary | ICD-10-CM | POA: Diagnosis not present

## 2017-07-04 DIAGNOSIS — F419 Anxiety disorder, unspecified: Secondary | ICD-10-CM | POA: Insufficient documentation

## 2017-07-04 DIAGNOSIS — E669 Obesity, unspecified: Secondary | ICD-10-CM

## 2017-07-04 DIAGNOSIS — J45909 Unspecified asthma, uncomplicated: Secondary | ICD-10-CM | POA: Diagnosis not present

## 2017-07-04 DIAGNOSIS — Z6841 Body Mass Index (BMI) 40.0 and over, adult: Secondary | ICD-10-CM | POA: Insufficient documentation

## 2017-07-05 ENCOUNTER — Ambulatory Visit (INDEPENDENT_AMBULATORY_CARE_PROVIDER_SITE_OTHER): Payer: BC Managed Care – PPO | Admitting: Obstetrics & Gynecology

## 2017-07-05 ENCOUNTER — Encounter: Payer: Self-pay | Admitting: Obstetrics & Gynecology

## 2017-07-05 ENCOUNTER — Encounter: Payer: Self-pay | Admitting: Skilled Nursing Facility1

## 2017-07-05 VITALS — BP 132/84

## 2017-07-05 DIAGNOSIS — Z30017 Encounter for initial prescription of implantable subdermal contraceptive: Secondary | ICD-10-CM | POA: Diagnosis not present

## 2017-07-05 LAB — PREGNANCY, URINE: Preg Test, Ur: NEGATIVE

## 2017-07-05 NOTE — Progress Notes (Signed)
    Jamie ParkJessie E Mcknight 03-12-1986 [id number]        31 y.o.  G0 Engaged, getting married 08/2017.  Bariatric surgery planned for 07/18/2017.  RP:  Insertion of Nexplanon  HPI:  Not having regular menses.  BCPs not tolerated.  No unprotected IC in the last 2 weeks.  UPT neg today.    Past medical history,surgical history, problem list, medications, allergies, family history and social history were all reviewed and documented in the EPIC chart.  Directed ROS with pertinent positives and negatives documented in the history of present illness/assessment and plan.  Exam:  Vitals:   07/05/17 1223  BP: 132/84   General appearance:  Normal                                              Nexplanon Procedure Note (insertion)   The patient was laying on her back with her nondominant (left) arm flexed at the elbow and externally rotated. The insertion site was identified as the underside of the nondominant upper arm approximately 8 cm from the medial epicondyle of the humerus. 2 marks were made with a sterile marker: The first marked the spot where the Nexplanon  implant was to be inserted, and a second, marked a spot a few centimeters proximal to the first marke to guide the direction of the insertion. The area was cleansed with Betadine solution. The area was anesthetized with 1% lidocaine  (1 cc)  at the area the injection site and underneath the skin along the planned insertion tunnel. The preloaded disposable Nexplanon was removed from its sterile casing. The applicator was held above the needle at the textured surface area. The transparent protector was removed. With a freehand, the skin was stretched around the insertion site with a thumb and index finger. The skin was then punctured with the tip of the needle angled at 30. The Nexplanon applicator was lowered to a horizontal position. While lifting the skin with the tip of the needle the needle was then slid to its full length. The applicator was kept in  sitting position with a needle inserted to its full length. The purple slider was unlocked by pushing it slightly downward. The slider was fully moved back until it stopped. This allowed the implant to be in the final subdermal position and the needle was locked inside the body of the applicator. The applicator was then removed. Steri-Strips and a Bandaid were put over the incision and a bandage was placed which patient is to remove tomorrow. No complications patient tolerated procedure well and was released home with instructions.   Assessment/Plan:  31 y.o. G0  1. Nexplanon insertion Easy insertion of Nexplanon for contraception.  UPT neg today.  Information on Nexplanon already discussed.  F/U  Annual/Gyn exam 12/2017.   Jamie DelMarie-Lyne Rubert Frediani MD, 12:47 PM 07/05/2017

## 2017-07-05 NOTE — Progress Notes (Signed)
Pre-Operative Nutrition Class:  Appt start time: 2703   End time:  1830.  Patient was seen on 07/04/2017 for Pre-Operative Bariatric Surgery Education at [facility].   Surgery date: Surgery type: RYGB Start weight at [facility]: 269.7 Weight today: 297.6  Samples given per MNT protocol. Patient educated on appropriate usage: Bariatric Advantage Multivitamin Lot # [id number] Exp:06/19  Bariatric Advantage Calcium  Lot # [id number] Exp: sep-26-2018  Unjury Protein  Shake Lot # [id number] Exp: Mar 20, 2018  The following the learning objectives were met by the patient during this course:  Identify Pre-Op Dietary Goals and will begin 2 weeks pre-operatively  Identify appropriate sources of fluids and proteins   State protein recommendations and appropriate sources pre and post-operatively  Identify Post-Operative Dietary Goals and will follow for 2 weeks post-operatively  Identify appropriate multivitamin and calcium sources  Describe the need for physical activity post-operatively and will follow MD recommendations  State when to call healthcare provider regarding medication questions or post-operative complications  Handouts given during class include:  Pre-Op Bariatric Surgery Diet Handout  Protein Shake Handout  Post-Op Bariatric Surgery Nutrition Handout  BELT Program Information Flyer  Support Group Information Flyer  [facility] Bariatric Supplements Price List  Follow-Up Plan: [facility] post [facility] for diet advancement per MD.

## 2017-07-05 NOTE — Patient Instructions (Signed)
1. Nexplanon insertion Easy insertion of Nexplanon for contraception.  UPT neg today.  Information on Nexplanon already discussed.  F/U  Annual/Gyn exam 12/2017.  Jamie Mcknight, it was a pleasure to see you today!  If all goes well, I will see you at your Annual/Gyn exam 12/2017.  If you have any problem, please call me for an appointment.  Nexplanon Instructions After Insertion   Keep bandage clean and dry for 24 hours   May use ice/Tylenol/Ibuprofen for soreness or pain   If you develop fever, drainage or increased warmth from incision site-contact office immediately

## 2017-07-06 NOTE — Progress Notes (Signed)
Please place orders in EPIC as patient is being scheduled for a pre-op appointment! Thank you! 

## 2017-07-11 NOTE — Patient Instructions (Signed)
Jamie Mcknight  07/11/2017   Your procedure is scheduled on: 07-18-17   Report to [facility] Main  Entrance Take [place]  elevators to 3rd floor to  Short Stay Center at 5:30 AM.   Call this number if you have problems the morning of surgery [phone]    Remember: ONLY 1 PERSON MAY GO WITH YOU TO SHORT STAY TO GET  READY MORNING OF YOUR SURGERY.  Do not eat food or drink liquids :After Midnight.     Take these medicines the morning of surgery with A SIP OF WATER: None                                You may not have any metal on your body including hair pins and              piercings  Do not wear jewelry, make-up, lotions, powders or perfumes, deodorant             Do not wear nail polish.  Do not shave  48 hours prior to surgery.                Do not bring valuables to the hospital. [facility] IS NOT             RESPONSIBLE   FOR VALUABLES.  Contacts, dentures or bridgework may not be worn into surgery.  Leave suitcase in the car. After surgery it may be brought to your room.                  Please read over the following fact sheets you were given: _____________________________________________________________________             [facility] - Preparing for Surgery Before surgery, you can play an important role.  Because skin is not sterile, your skin needs to be as free of germs as possible.  You can reduce the number of germs on your skin by washing with CHG (chlorahexidine gluconate) soap before surgery.  CHG is an antiseptic cleaner which kills germs and bonds with the skin to continue killing germs even after washing. Please DO NOT use if you have an allergy to CHG or antibacterial soaps.  If your skin becomes reddened/irritated stop using the CHG and inform your nurse when you arrive at Short Stay. Do not shave (including legs and underarms) for at least 48 hours prior to the first CHG shower.  You may shave your face/neck. Please follow these  instructions carefully:  1.  Shower with CHG Soap the night before surgery and the  morning of Surgery.  2.  If you choose to wash your hair, wash your hair first as usual with your  normal  shampoo.  3.  After you shampoo, rinse your hair and body thoroughly to remove the  shampoo.                           4.  Use CHG as you would any other liquid soap.  You can apply chg directly  to the skin and wash                       Gently with a scrungie or clean washcloth.  5.  Apply the CHG Soap to your body  ONLY FROM THE NECK DOWN.   Do not use on face/ open                           Wound or open sores. Avoid contact with eyes, ears mouth and genitals (private parts).                       Wash face,  Genitals (private parts) with your normal soap.             6.  Wash thoroughly, paying special attention to the area where your surgery  will be performed.  7.  Thoroughly rinse your body with warm water from the neck down.  8.  DO NOT shower/wash with your normal soap after using and rinsing off  the CHG Soap.                9.  Pat yourself dry with a clean towel.            10.  Wear clean pajamas.            11.  Place clean sheets on your bed the night of your first shower and do not  sleep with pets. Day of Surgery : Do not apply any lotions/deodorants the morning of surgery.  Please wear clean clothes to the hospital/surgery center.  FAILURE TO FOLLOW THESE INSTRUCTIONS MAY RESULT IN THE CANCELLATION OF YOUR SURGERY PATIENT SIGNATURE_________________________________  NURSE SIGNATURE__________________________________  ________________________________________________________________________

## 2017-07-11 NOTE — Progress Notes (Signed)
05-27-17 (EPIC) EKG, CXR

## 2017-07-12 ENCOUNTER — Encounter: Payer: Self-pay | Admitting: Anesthesiology

## 2017-07-13 ENCOUNTER — Encounter (HOSPITAL_COMMUNITY): Payer: Self-pay

## 2017-07-13 ENCOUNTER — Encounter (HOSPITAL_COMMUNITY)
Admission: RE | Admit: 2017-07-13 | Discharge: 2017-07-13 | Disposition: A | Discharge status: Home / Self Care | Payer: BC Managed Care – PPO | Source: Ambulatory Visit | Attending: General Surgery | Admitting: General Surgery

## 2017-07-13 DIAGNOSIS — Z6841 Body Mass Index (BMI) 40.0 and over, adult: Secondary | ICD-10-CM | POA: Diagnosis not present

## 2017-07-13 DIAGNOSIS — Z01812 Encounter for preprocedural laboratory examination: Secondary | ICD-10-CM | POA: Insufficient documentation

## 2017-07-13 DIAGNOSIS — E282 Polycystic ovarian syndrome: Secondary | ICD-10-CM | POA: Insufficient documentation

## 2017-07-13 LAB — CBC
HCT: 41.3 % (ref 36.0–46.0)
HEMOGLOBIN: 14.5 g/dL (ref 12.0–15.0)
MCH: 29.1 pg (ref 26.0–34.0)
MCHC: 35.1 g/dL (ref 30.0–36.0)
MCV: 82.8 fL (ref 78.0–100.0)
Platelets: 257 10*3/uL (ref 150–400)
RBC: 4.99 MIL/uL (ref 3.87–5.11)
RDW: 13.5 % (ref 11.5–15.5)
WBC: 10.6 10*3/uL — ABNORMAL HIGH (ref 4.0–10.5)

## 2017-07-13 LAB — PREGNANCY, URINE: Preg Test, Ur: NEGATIVE

## 2017-07-14 ENCOUNTER — Ambulatory Visit: Payer: Self-pay | Admitting: General Surgery

## 2017-07-17 NOTE — Anesthesia Preprocedure Evaluation (Addendum)
Anesthesia Evaluation  Patient identified by MRN, date of birth, ID band Patient awake    Reviewed: Allergy & Precautions, NPO status , Patient's Chart, lab work & pertinent test results  Airway Mallampati: III  TM Distance: >3 FB Neck ROM: Full    Dental  (+) Dental Advisory Given   Pulmonary neg pulmonary ROS,    breath sounds clear to auscultation       Cardiovascular negative cardio ROS   Rhythm:Regular Rate:Normal     Neuro/Psych Anxiety Depression negative neurological ROS     GI/Hepatic negative GI ROS, Neg liver ROS,   Endo/Other  Morbid obesity  Renal/GU negative Renal ROS     Musculoskeletal   Abdominal   Peds  Hematology negative hematology ROS (+)   Anesthesia Other Findings   Reproductive/Obstetrics                            Lab Results  Component Value Date   WBC 10.6 (H) 07/13/2017   HGB 14.5 07/13/2017   HCT 41.3 07/13/2017   MCV 82.8 07/13/2017   PLT 257 07/13/2017    Anesthesia Physical Anesthesia Plan  ASA: III  Anesthesia Plan: General   Post-op Pain Management:    Induction: Intravenous  PONV Risk Score and Plan: 4 or greater and Ondansetron, Dexamethasone, Midazolam, Scopolamine patch - Pre-op and Treatment may vary due to age or medical condition  Airway Management Planned: Oral ETT  Additional Equipment: None  Intra-op Plan:   Post-operative Plan: Extubation in OR  Informed Consent: I have reviewed the patients History and Physical, chart, labs and discussed the procedure including the risks, benefits and alternatives for the proposed anesthesia with the patient or authorized representative who has indicated his/her understanding and acceptance.   Dental advisory given  Plan Discussed with: CRNA  Anesthesia Plan Comments:        Anesthesia Quick Evaluation

## 2017-07-18 ENCOUNTER — Inpatient Hospital Stay (HOSPITAL_COMMUNITY)
Admission: RE | Admit: 2017-07-18 | Discharge: 2017-07-19 | DRG: 621 | Disposition: A | Discharge status: Home / Self Care | Payer: BC Managed Care – PPO | Source: Ambulatory Visit | Attending: General Surgery | Admitting: General Surgery

## 2017-07-18 ENCOUNTER — Inpatient Hospital Stay (HOSPITAL_COMMUNITY): Payer: BC Managed Care – PPO | Admitting: Anesthesiology

## 2017-07-18 ENCOUNTER — Encounter (HOSPITAL_COMMUNITY): Payer: Self-pay | Admitting: *Deleted

## 2017-07-18 ENCOUNTER — Encounter (HOSPITAL_COMMUNITY)
Admission: RE | Disposition: A | Discharge status: Home / Self Care | Payer: Self-pay | Source: Ambulatory Visit | Attending: General Surgery

## 2017-07-18 DIAGNOSIS — Z23 Encounter for immunization: Secondary | ICD-10-CM | POA: Diagnosis not present

## 2017-07-18 DIAGNOSIS — Z6841 Body Mass Index (BMI) 40.0 and over, adult: Secondary | ICD-10-CM

## 2017-07-18 HISTORY — PX: GASTRIC ROUX-EN-Y: SHX5262

## 2017-07-18 LAB — CBC
HCT: 39.3 % (ref 36.0–46.0)
HEMOGLOBIN: 13.5 g/dL (ref 12.0–15.0)
MCH: 29.2 pg (ref 26.0–34.0)
MCHC: 34.4 g/dL (ref 30.0–36.0)
MCV: 84.9 fL (ref 78.0–100.0)
PLATELETS: 250 10*3/uL (ref 150–400)
RBC: 4.63 MIL/uL (ref 3.87–5.11)
RDW: 13.7 % (ref 11.5–15.5)
WBC: 16 10*3/uL — ABNORMAL HIGH (ref 4.0–10.5)

## 2017-07-18 LAB — COMPREHENSIVE METABOLIC PANEL
ALBUMIN: 3.9 g/dL (ref 3.5–5.0)
ALT: 19 U/L (ref 14–54)
AST: 19 U/L (ref 15–41)
Alkaline Phosphatase: 116 U/L (ref 38–126)
Anion gap: 7 (ref 5–15)
BILIRUBIN TOTAL: 0.5 mg/dL (ref 0.3–1.2)
BUN: 16 mg/dL (ref 6–20)
CO2: 24 mmol/L (ref 22–32)
Calcium: 8.9 mg/dL (ref 8.9–10.3)
Chloride: 110 mmol/L (ref 101–111)
Creatinine, Ser: 0.74 mg/dL (ref 0.44–1.00)
GFR calc Af Amer: >60 mL/min (ref >60)
GFR calc non Af Amer: >60 mL/min (ref >60)
GLUCOSE: 107 mg/dL — AB (ref 65–99)
POTASSIUM: 4.5 mmol/L (ref 3.5–5.1)
Sodium: 141 mmol/L (ref 135–145)
TOTAL PROTEIN: 7.3 g/dL (ref 6.5–8.1)

## 2017-07-18 LAB — GLUCOSE, CAPILLARY: Glucose-Capillary: 104 mg/dL — ABNORMAL HIGH (ref 65–99)

## 2017-07-18 LAB — CREATININE, SERUM: Creatinine, Ser: 0.73 mg/dL (ref 0.44–1.00)

## 2017-07-18 LAB — PREGNANCY, URINE: Preg Test, Ur: NEGATIVE

## 2017-07-18 SURGERY — LAPAROSCOPIC ROUX-EN-Y GASTRIC BYPASS WITH UPPER ENDOSCOPY
Anesthesia: General

## 2017-07-18 MED ORDER — ROCURONIUM BROMIDE 50 MG/5ML IV SOSY
PREFILLED_SYRINGE | INTRAVENOUS | Status: AC
  Filled 2017-07-18: qty 5

## 2017-07-18 MED ORDER — ACETAMINOPHEN 160 MG/5ML PO SOLN: 325.0000 mg | ORAL | Status: DC | PRN

## 2017-07-18 MED ORDER — LACTATED RINGERS IR SOLN
Status: DC | PRN
  Administered 2017-07-18: 1000 mL

## 2017-07-18 MED ORDER — ACETAMINOPHEN 325 MG PO TABS: 650.0000 mg | ORAL_TABLET | ORAL | Status: DC | PRN

## 2017-07-18 MED ORDER — FENTANYL CITRATE (PF) 100 MCG/2ML IJ SOLN
INTRAMUSCULAR | Status: AC
  Filled 2017-07-18: qty 2

## 2017-07-18 MED ORDER — SUGAMMADEX SODIUM 500 MG/5ML IV SOLN
INTRAVENOUS | Status: DC | PRN
  Administered 2017-07-18: 350 mg via INTRAVENOUS

## 2017-07-18 MED ORDER — SUGAMMADEX SODIUM 500 MG/5ML IV SOLN
INTRAVENOUS | Status: AC
  Filled 2017-07-18: qty 5

## 2017-07-18 MED ORDER — HYDRALAZINE HCL 20 MG/ML IJ SOLN: 10.0000 mg | INTRAMUSCULAR | Status: DC | PRN

## 2017-07-18 MED ORDER — ROCURONIUM BROMIDE 10 MG/ML (PF) SYRINGE
PREFILLED_SYRINGE | INTRAVENOUS | Status: DC | PRN
  Administered 2017-07-18 (×3): 20 mg via INTRAVENOUS
  Administered 2017-07-18: 10 mg via INTRAVENOUS
  Administered 2017-07-18: 50 mg via INTRAVENOUS

## 2017-07-18 MED ORDER — HEPARIN SODIUM (PORCINE) 5000 UNIT/ML IJ SOLN
5000.0000 [IU] | INTRAMUSCULAR | Status: AC
  Administered 2017-07-18: 5000 [IU] via SUBCUTANEOUS
  Filled 2017-07-18: qty 1

## 2017-07-18 MED ORDER — DEXAMETHASONE SODIUM PHOSPHATE 4 MG/ML IJ SOLN: 4.0000 mg | INTRAMUSCULAR | Status: DC

## 2017-07-18 MED ORDER — BUPIVACAINE-EPINEPHRINE (PF) 0.25% -1:200000 IJ SOLN
INTRAMUSCULAR | Status: AC
  Filled 2017-07-18: qty 30

## 2017-07-18 MED ORDER — FENTANYL CITRATE (PF) 100 MCG/2ML IJ SOLN
25.0000 ug | INTRAMUSCULAR | Status: DC | PRN
  Administered 2017-07-18 (×2): 50 ug via INTRAVENOUS

## 2017-07-18 MED ORDER — ONDANSETRON HCL 4 MG/2ML IJ SOLN
INTRAMUSCULAR | Status: AC
  Filled 2017-07-18: qty 2

## 2017-07-18 MED ORDER — PANTOPRAZOLE SODIUM 40 MG IV SOLR
40.0000 mg | Freq: Every day | INTRAVENOUS | Status: DC
  Administered 2017-07-18: 40 mg via INTRAVENOUS
  Filled 2017-07-18: qty 40

## 2017-07-18 MED ORDER — MIDAZOLAM HCL 2 MG/2ML IJ SOLN
INTRAMUSCULAR | Status: DC | PRN
  Administered 2017-07-18: 2 mg via INTRAVENOUS

## 2017-07-18 MED ORDER — PROMETHAZINE HCL 25 MG/ML IJ SOLN: 6.2500 mg | INTRAMUSCULAR | Status: DC | PRN

## 2017-07-18 MED ORDER — LACTATED RINGERS IV SOLN
INTRAVENOUS | Status: DC | PRN
  Administered 2017-07-18 (×2): via INTRAVENOUS

## 2017-07-18 MED ORDER — CEFOTETAN DISODIUM-DEXTROSE 2-2.08 GM-% IV SOLR
INTRAVENOUS | Status: AC
  Filled 2017-07-18: qty 50

## 2017-07-18 MED ORDER — PREMIER PROTEIN SHAKE
2.0000 [oz_av] | ORAL | Status: DC
  Administered 2017-07-19 (×3): 2 [oz_av] via ORAL

## 2017-07-18 MED ORDER — ONDANSETRON HCL 4 MG/2ML IJ SOLN: 4.0000 mg | INTRAMUSCULAR | Status: DC | PRN

## 2017-07-18 MED ORDER — LIDOCAINE 2% (20 MG/ML) 5 ML SYRINGE
INTRAMUSCULAR | Status: AC
  Filled 2017-07-18: qty 5

## 2017-07-18 MED ORDER — CHLORHEXIDINE GLUCONATE 4 % EX LIQD: 60.0000 mL | Freq: Once | CUTANEOUS | Status: DC

## 2017-07-18 MED ORDER — PROPOFOL 10 MG/ML IV BOLUS
INTRAVENOUS | Status: AC
  Filled 2017-07-18: qty 20

## 2017-07-18 MED ORDER — SODIUM CHLORIDE 0.9 % IV SOLN
INTRAVENOUS | Status: DC
  Administered 2017-07-18 – 2017-07-19 (×2): via INTRAVENOUS

## 2017-07-18 MED ORDER — CEFOTETAN DISODIUM-DEXTROSE 2-2.08 GM-% IV SOLR
2.0000 g | INTRAVENOUS | Status: AC
  Administered 2017-07-18: 2 g via INTRAVENOUS

## 2017-07-18 MED ORDER — MIDAZOLAM HCL 2 MG/2ML IJ SOLN
INTRAMUSCULAR | Status: AC
  Filled 2017-07-18: qty 2

## 2017-07-18 MED ORDER — KETAMINE HCL 10 MG/ML IJ SOLN
INTRAMUSCULAR | Status: AC
  Filled 2017-07-18: qty 1

## 2017-07-18 MED ORDER — KETAMINE HCL 10 MG/ML IJ SOLN
INTRAMUSCULAR | Status: DC | PRN
  Administered 2017-07-18: 30 mg via INTRAVENOUS
  Administered 2017-07-18 (×2): 10 mg via INTRAVENOUS

## 2017-07-18 MED ORDER — MORPHINE SULFATE (PF) 2 MG/ML IV SOLN
1.0000 mg | INTRAVENOUS | Status: DC | PRN
  Administered 2017-07-18: 2 mg via INTRAVENOUS
  Filled 2017-07-18: qty 1

## 2017-07-18 MED ORDER — PROPOFOL 10 MG/ML IV BOLUS
INTRAVENOUS | Status: DC | PRN
  Administered 2017-07-18: 200 mg via INTRAVENOUS

## 2017-07-18 MED ORDER — GABAPENTIN 300 MG PO CAPS
300.0000 mg | ORAL_CAPSULE | ORAL | Status: AC
  Administered 2017-07-18: 300 mg via ORAL
  Filled 2017-07-18: qty 1

## 2017-07-18 MED ORDER — DEXAMETHASONE SODIUM PHOSPHATE 10 MG/ML IJ SOLN
INTRAMUSCULAR | Status: DC | PRN
  Administered 2017-07-18: 10 mg via INTRAVENOUS

## 2017-07-18 MED ORDER — SCOPOLAMINE 1 MG/3DAYS TD PT72
1.0000 | MEDICATED_PATCH | TRANSDERMAL | Status: DC
  Administered 2017-07-18: 1.5 mg via TRANSDERMAL
  Filled 2017-07-18: qty 1

## 2017-07-18 MED ORDER — OXYCODONE HCL 5 MG/5ML PO SOLN
5.0000 mg | ORAL | Status: DC | PRN
  Administered 2017-07-18 – 2017-07-19 (×2): 5 mg via ORAL
  Filled 2017-07-18 (×2): qty 5

## 2017-07-18 MED ORDER — FENTANYL CITRATE (PF) 250 MCG/5ML IJ SOLN
INTRAMUSCULAR | Status: AC
  Filled 2017-07-18: qty 5

## 2017-07-18 MED ORDER — BUPIVACAINE-EPINEPHRINE 0.25% -1:200000 IJ SOLN
INTRAMUSCULAR | Status: DC | PRN
  Administered 2017-07-18: 30 mL

## 2017-07-18 MED ORDER — DEXAMETHASONE SODIUM PHOSPHATE 10 MG/ML IJ SOLN
INTRAMUSCULAR | Status: AC
  Filled 2017-07-18: qty 1

## 2017-07-18 MED ORDER — LIDOCAINE 2% (20 MG/ML) 5 ML SYRINGE
INTRAMUSCULAR | Status: DC | PRN
  Administered 2017-07-18: 1.5 mg/kg/h via INTRAVENOUS

## 2017-07-18 MED ORDER — SIMETHICONE 80 MG PO CHEW: 80.0000 mg | CHEWABLE_TABLET | Freq: Four times a day (QID) | ORAL | Status: DC | PRN

## 2017-07-18 MED ORDER — INFLUENZA VAC SPLIT QUAD 0.5 ML IM SUSY
0.5000 mL | PREFILLED_SYRINGE | INTRAMUSCULAR | Status: AC
  Administered 2017-07-19: 0.5 mL via INTRAMUSCULAR
  Filled 2017-07-18: qty 0.5

## 2017-07-18 MED ORDER — ACETAMINOPHEN 500 MG PO TABS
1000.0000 mg | ORAL_TABLET | ORAL | Status: AC
  Administered 2017-07-18: 1000 mg via ORAL
  Filled 2017-07-18: qty 2

## 2017-07-18 MED ORDER — LIDOCAINE 2% (20 MG/ML) 5 ML SYRINGE
INTRAMUSCULAR | Status: AC
  Filled 2017-07-18: qty 10

## 2017-07-18 MED ORDER — BUPIVACAINE LIPOSOME 1.3 % IJ SUSP
20.0000 mL | Freq: Once | INTRAMUSCULAR | Status: AC
  Administered 2017-07-18: 20 mL
  Filled 2017-07-18: qty 20

## 2017-07-18 MED ORDER — 0.9 % SODIUM CHLORIDE (POUR BTL) OPTIME
TOPICAL | Status: DC | PRN
  Administered 2017-07-18: 1000 mL

## 2017-07-18 MED ORDER — TRAMADOL HCL 50 MG PO TABS: 50.0000 mg | ORAL_TABLET | Freq: Four times a day (QID) | ORAL | Status: DC | PRN

## 2017-07-18 MED ORDER — LIDOCAINE 2% (20 MG/ML) 5 ML SYRINGE
INTRAMUSCULAR | Status: DC | PRN
  Administered 2017-07-18: 100 mg via INTRAVENOUS

## 2017-07-18 MED ORDER — EPHEDRINE SULFATE-NACL 50-0.9 MG/10ML-% IV SOSY
PREFILLED_SYRINGE | INTRAVENOUS | Status: DC | PRN
  Administered 2017-07-18: 10 mg via INTRAVENOUS

## 2017-07-18 MED ORDER — FENTANYL CITRATE (PF) 100 MCG/2ML IJ SOLN
INTRAMUSCULAR | Status: DC | PRN
  Administered 2017-07-18 (×3): 50 ug via INTRAVENOUS
  Administered 2017-07-18: 100 ug via INTRAVENOUS
  Administered 2017-07-18 (×2): 50 ug via INTRAVENOUS

## 2017-07-18 MED ORDER — APREPITANT 40 MG PO CAPS
40.0000 mg | ORAL_CAPSULE | ORAL | Status: AC
  Administered 2017-07-18: 40 mg via ORAL
  Filled 2017-07-18: qty 1

## 2017-07-18 MED ORDER — ENOXAPARIN SODIUM 30 MG/0.3ML ~~LOC~~ SOLN
30.0000 mg | Freq: Two times a day (BID) | SUBCUTANEOUS | Status: DC
  Administered 2017-07-18 – 2017-07-19 (×2): 30 mg via SUBCUTANEOUS
  Filled 2017-07-18 (×2): qty 0.3

## 2017-07-18 SURGICAL SUPPLY — 71 items
APL SKNCLS STERI-STRIP NONHPOA (GAUZE/BANDAGES/DRESSINGS) ×1
APPLIER CLIP 5 13 M/L LIGAMAX5 (MISCELLANEOUS)
APPLIER CLIP ROT 10 11.4 M/L (STAPLE)
APPLIER CLIP ROT 13.4 12 LRG (CLIP)
APR CLP LRG 13.4X12 ROT 20 MLT (CLIP)
APR CLP MED LRG 11.4X10 (STAPLE)
APR CLP MED LRG 5 ANG JAW (MISCELLANEOUS)
BANDAGE ADH SHEER 1  50/CT (GAUZE/BANDAGES/DRESSINGS) IMPLANT
BENZOIN TINCTURE PRP APPL 2/3 (GAUZE/BANDAGES/DRESSINGS) ×3 IMPLANT
BLADE SURG SZ11 CARB STEEL (BLADE) ×3 IMPLANT
CABLE HIGH FREQUENCY MONO STRZ (ELECTRODE) ×3 IMPLANT
CHLORAPREP W/TINT 26ML (MISCELLANEOUS) ×3 IMPLANT
CLIP APPLIE 5 13 M/L LIGAMAX5 (MISCELLANEOUS) IMPLANT
CLIP APPLIE ROT 10 11.4 M/L (STAPLE) IMPLANT
CLIP APPLIE ROT 13.4 12 LRG (CLIP) IMPLANT
CLOSURE WOUND 1/2 X4 (GAUZE/BANDAGES/DRESSINGS) ×1
COVER SURGICAL LIGHT HANDLE (MISCELLANEOUS) ×3 IMPLANT
DEVICE SUTURE ENDOST 10MM (ENDOMECHANICALS) ×3 IMPLANT
DRAIN CHANNEL 19F RND (DRAIN) IMPLANT
DRAIN PENROSE 18X1/4 LTX STRL (WOUND CARE) ×3 IMPLANT
ELECT L-HOOK LAP 45CM DISP (ELECTROSURGICAL) ×3
ELECT PENCIL ROCKER SW 15FT (MISCELLANEOUS) ×3 IMPLANT
ELECTRODE L-HOOK LAP 45CM DISP (ELECTROSURGICAL) ×1 IMPLANT
EVACUATOR SILICONE 100CC (DRAIN) IMPLANT
GAUZE SPONGE 4X4 12PLY STRL (GAUZE/BANDAGES/DRESSINGS) IMPLANT
GAUZE SPONGE 4X4 16PLY XRAY LF (GAUZE/BANDAGES/DRESSINGS) ×3 IMPLANT
GLOVE BIOGEL PI IND STRL 7.0 (GLOVE) ×1 IMPLANT
GLOVE BIOGEL PI INDICATOR 7.0 (GLOVE) ×2
GLOVE SURG SS PI 7.0 STRL IVOR (GLOVE) ×3 IMPLANT
GOWN STRL REUS W/TWL LRG LVL3 (GOWN DISPOSABLE) ×3 IMPLANT
GOWN STRL REUS W/TWL XL LVL3 (GOWN DISPOSABLE) ×9 IMPLANT
GRASPER SUT TROCAR 14GX15 (MISCELLANEOUS) IMPLANT
HANDLE STAPLE EGIA 4 XL (STAPLE) ×3 IMPLANT
HOVERMATT SINGLE USE (MISCELLANEOUS) ×3 IMPLANT
KIT BASIN OR (CUSTOM PROCEDURE TRAY) ×3 IMPLANT
KIT GASTRIC LAVAGE 34FR ADT (SET/KITS/TRAYS/PACK) IMPLANT
MARKER SKIN DUAL TIP RULER LAB (MISCELLANEOUS) ×3 IMPLANT
NEEDLE SPNL 22GX3.5 QUINCKE BK (NEEDLE) ×3 IMPLANT
PACK CARDIOVASCULAR III (CUSTOM PROCEDURE TRAY) ×3 IMPLANT
RELOAD EGIA 45 MED/THCK PURPLE (STAPLE) ×3 IMPLANT
RELOAD EGIA 45 TAN VASC (STAPLE) IMPLANT
RELOAD EGIA 60 MED/THCK PURPLE (STAPLE) ×12 IMPLANT
RELOAD EGIA 60 TAN VASC (STAPLE) ×9 IMPLANT
RELOAD ENDO STITCH 2.0 (ENDOMECHANICALS) ×33
SCISSORS LAP 5X45 EPIX DISP (ENDOMECHANICALS) ×3 IMPLANT
SET IRRIG TUBING LAPAROSCOPIC (IRRIGATION / IRRIGATOR) ×3 IMPLANT
SHEARS HARMONIC ACE PLUS 45CM (MISCELLANEOUS) ×3 IMPLANT
SLEEVE XCEL OPT CAN 5 100 (ENDOMECHANICALS) ×9 IMPLANT
SOLUTION ANTI FOG 6CC (MISCELLANEOUS) ×3 IMPLANT
STAPLER VISISTAT 35W (STAPLE) IMPLANT
STRIP CLOSURE SKIN 1/2X4 (GAUZE/BANDAGES/DRESSINGS) ×2 IMPLANT
SUT ETHILON 2 0 PS N (SUTURE) IMPLANT
SUT MNCRL AB 4-0 PS2 18 (SUTURE) ×3 IMPLANT
SUT RELOAD ENDO STITCH 2 48X1 (ENDOMECHANICALS) ×6
SUT RELOAD ENDO STITCH 2.0 (ENDOMECHANICALS) ×5
SUT SILK 0 SH 30 (SUTURE) IMPLANT
SUT VICRYL 0 TIES 12 18 (SUTURE) IMPLANT
SUTURE RELOAD END STTCH 2 48X1 (ENDOMECHANICALS) ×6 IMPLANT
SUTURE RELOAD ENDO STITCH 2.0 (ENDOMECHANICALS) ×5 IMPLANT
SYR 20CC LL (SYRINGE) ×3 IMPLANT
SYR 50ML LL SCALE MARK (SYRINGE) ×3 IMPLANT
TOWEL OR 17X26 10 PK STRL BLUE (TOWEL DISPOSABLE) ×3 IMPLANT
TOWEL OR NON WOVEN STRL DISP B (DISPOSABLE) ×3 IMPLANT
TRAY FOLEY CATH SILVER 14FR (SET/KITS/TRAYS/PACK) ×3 IMPLANT
TRAY FOLEY W/METER SILVER 14FR (SET/KITS/TRAYS/PACK) IMPLANT
TROCAR BLADELESS OPT 5 100 (ENDOMECHANICALS) ×3 IMPLANT
TROCAR XCEL 12X100 BLDLESS (ENDOMECHANICALS) ×3 IMPLANT
TUBING CONNECTING 10 (TUBING) ×4 IMPLANT
TUBING CONNECTING 10' (TUBING) ×2
TUBING ENDO SMARTCAP PENTAX (MISCELLANEOUS) ×3 IMPLANT
TUBING INSUF HEATED (TUBING) ×3 IMPLANT

## 2017-07-18 NOTE — H&P (Signed)
History of Present Illness Malachi Carl MD; 07/06/2017 5:43 PM) Patient words: pre-op.  The patient is a 31 year old female who presents for a bariatric surgery evaluation. Patient has completed all necessary workup in the preoperative phase for bariatric surgery. The patient is now ready to proceed with surgery. The patient has made moderate improvements in her diet and is exercising well and has no new medical issues or new medications.    Allergies Doristine Devoid, CMA; 07/06/2017 5:19 PM) No Known Drug Allergies 05/05/2017  Medication History Doristine Devoid, CMA; 07/06/2017 5:19 PM) Albuterol Sulfate HFA (108 (90 Base)MCG/ACT Aerosol Soln, Inhalation) Active. Zenchent (0.4-35MG -MCG Tablet, Oral) Active. Medications Reconciled    Review of Systems Malachi Carl MD; 07/06/2017 5:43 PM) General Not Present- Appetite Loss, Chills, Fatigue, Fever, Night Sweats, Weight Gain and Weight Loss. Skin Not Present- Change in Wart/Mole, Dryness, Hives, Jaundice, New Lesions, Non-Healing Wounds, Rash and Ulcer. HEENT Present- Seasonal Allergies and Wears glasses/contact lenses. Not Present- Earache, Hearing Loss, Hoarseness, Nose Bleed, Oral Ulcers, Ringing in the Ears, Sinus Pain, Sore Throat, Visual Disturbances and Yellow Eyes. Respiratory Present- Difficulty Breathing. Not Present- Bloody sputum, Chronic Cough, Snoring and Wheezing. Breast Not Present- Breast Mass, Breast Pain, Nipple Discharge and Skin Changes. Cardiovascular Not Present- Chest Pain, Difficulty Breathing Lying Down, Leg Cramps, Palpitations, Rapid Heart Rate, Shortness of Breath and Swelling of Extremities. Gastrointestinal Not Present- Abdominal Pain, Bloating, Bloody Stool, Change in Bowel Habits, Chronic diarrhea, Constipation, Difficulty Swallowing, Excessive gas, Gets full quickly at meals, Hemorrhoids, Indigestion, Nausea, Rectal Pain and Vomiting. Female Genitourinary Present- Nocturia. Not Present- Frequency,  Painful Urination, Pelvic Pain and Urgency. Musculoskeletal Not Present- Back Pain, Joint Pain, Joint Stiffness, Muscle Pain, Muscle Weakness and Swelling of Extremities. Neurological Not Present- Decreased Memory, Fainting, Headaches, Numbness, Seizures, Tingling, Tremor, Trouble walking and Weakness. Psychiatric Present- Anxiety. Not Present- Bipolar, Change in Sleep Pattern, Depression, Fearful and Frequent crying. Endocrine Not Present- Cold Intolerance, Excessive Hunger, Hair Changes, Heat Intolerance, Hot flashes and New Diabetes. Hematology Not Present- Blood Thinners, Easy Bruising, Excessive bleeding, Gland problems, HIV and Persistent Infections.  Vitals (Chemira Jones CMA; 07/06/2017 5:19 PM) 07/06/2017 5:18 PM Weight: 274.6 lb Height: 65in Body Surface Area: 2.26 m Body Mass Index: 45.7 kg/m  Temp.: 69F(Oral)  Pulse: 132 (Regular)  BP: 118/84 (Sitting, Left Arm, Standard)       Physical Exam Malachi Carl MD; 07/06/2017 5:44 PM) General Mental Status-Alert. General Appearance-Cooperative. Orientation-Oriented X4. Build & Nutrition-Obese. Posture-Normal posture.  Integumentary Global Assessment Upon inspection and palpation of skin surfaces of the - Head/Face: no rashes, ulcers, lesions or evidence of photo damage. No palpable nodules or masses and Neck: no visible lesions or palpable masses.  Head and Neck Head-normocephalic, atraumatic with no lesions or palpable masses. Face Global Assessment - atraumatic. Thyroid Gland Characteristics - normal size and consistency.  Eye Eyeball - Bilateral-Extraocular movements intact. Sclera/Conjunctiva - Bilateral-No scleral icterus, No Discharge.  ENMT Nose and Sinuses External Inspection of the Nose - no deformities observed, no swelling present.  Chest and Lung Exam Palpation Palpation of the chest reveals - Non-tender. Auscultation Breath sounds -  Normal.  Cardiovascular Auscultation Rhythm - Regular. Heart Sounds - S1 WNL and S2 WNL. Carotid arteries - No Carotid bruit.  Abdomen Inspection Inspection of the abdomen reveals - No Visible peristalsis, No Abnormal pulsations and No Paradoxical movements. Palpation/Percussion Palpation and Percussion of the abdomen reveal - Soft, Non Tender, No Rebound tenderness, No Rigidity (guarding), No hepatosplenomegaly and No  Palpable abdominal masses.  Peripheral Vascular Upper Extremity Palpation - Pulses bilaterally normal. Lower Extremity Palpation - Edema - Bilateral - No edema.  Neurologic Neurologic evaluation reveals -normal sensation and normal coordination.  Neuropsychiatric Mental status exam performed with findings of-able to articulate well with normal speech/language, rate, volume and coherence and thought content normal with ability to perform basic computations and apply abstract reasoning.  Musculoskeletal Normal Exam - Bilateral-Upper Extremity Strength Normal and Lower Extremity Strength Normal.    Assessment & Plan Malachi Carl MD; 07/06/2017 5:45 PM) MORBID OBESITY (E66.01) Story: 31 yo female with a long history of class III obesity. She has tried multiple attempts at weight loss in the past without durable success. She has now completed all requirements and is ready to proceed with gastric bypass Impression: The patient meets weight loss surgery criteria. Due to the above reasons, I think laparoscopic RNY gastric bypass is the best option for the patient.  We discussed RNY gastric bypass. We discussed the preoperative, operative and postoperative process. I explained the surgery in detail including the performance of an EGD near the end of the surgery to test for leak. We discussed the typical hospital course including a 2-3 day stay baring any complications. The patient was given educational material. I quoted the patient that most patients can lose up to  50-70% of their excess weight. We did discuss the possibility of weight regain several years after the procedure.  The risks of infection, bleeding, pain, scarring, weight regain, too little or too much weight loss, vitamin deficiencies and need for lifelong vitamin supplementation, hair loss, need for protein supplementation, leaks, stricture, reflux, food intolerance, gallstone formation, hernia, need for reoperation, need for open surgery, injury to spleen or surrounding structures, DVT's, PE, and death again discussed with the patient and the patient expressed understanding and desires to proceed with laparoscopic Roux en Y gastric bypass, possible open, intraoperative endoscopy.  We discussed that before and after surgery that there would be an alteration in their diet. I explained that we have put them on a diet 2 weeks before surgery. I also explained that they would be on a liquid diet for 2 weeks after surgery. We discussed that they would have to avoid certain foods after surgery. We discussed the importance of physical activity as well as compliance with our dietary and supplement recommendations and routine follow-up. Current Plans Started Pantoprazole Sodium , 1 (one) Tablet daily, #30, 07/06/2017, Ref. x2. Started TraMADol HCl , 1 (one) Tablet every six hours, as needed, #30, 07/06/2017, No Refill. Started Ondansetron , 1 (one) Tablet every six hours, as needed, #20, 07/06/2017, No Refill.

## 2017-07-18 NOTE — Anesthesia Procedure Notes (Addendum)
Procedure Name: Intubation Date/Time: 07/18/2017 7:33 AM Performed by: Sharlette Dense Pre-anesthesia Checklist: Patient identified, Emergency Drugs available, Suction available and Patient being monitored Patient Re-evaluated:Patient Re-evaluated prior to induction Oxygen Delivery Method: Circle system utilized Preoxygenation: Pre-oxygenation with 100% oxygen Induction Type: IV induction Ventilation: Mask ventilation without difficulty Laryngoscope Size: Mac and 3 Grade View: Grade II Tube type: Oral Tube size: 7.5 mm Number of attempts: 1 Airway Equipment and Method: Stylet and Oral airway Placement Confirmation: ETT inserted through vocal cords under direct vision,  positive ETCO2 and breath sounds checked- equal and bilateral Tube secured with: Tape Dental Injury: Teeth and Oropharynx as per pre-operative assessment  Comments: Intubation performed by Harlon Ditty, SRNA

## 2017-07-18 NOTE — Op Note (Signed)
Preop Diagnosis: Obesity Class III  Postop Diagnosis: same  Procedure performed: laparoscopic Roux en Y gastric bypass  Assitant: Gaynelle Adu  Indications:  The patient is a 31 y.o. year-old morbidly obese female who has been followed in the Bariatric Clinic as an outpatient. This patient was diagnosed with morbid obesity with a BMI of Body mass index is 46.41 kg/m. and significant co-morbidities including osteoarthritis.  The patient was counseled extensively in the Bariatric Outpatient Clinic and after a thorough explanation of the risks and benefits of surgery (including death from complications, bowel leak, infection such as peritonitis and/or sepsis, internal hernia, bleeding, need for blood transfusion, bowel obstruction, organ failure, pulmonary embolus, deep venous thrombosis, wound infection, incisional hernia, skin breakdown, and others entailed on the consent form) and after a compliant diet and exercise program, the patient was scheduled for an elective laparoscopic sleeve gastrectomy.  Description of Operation:  Following informed consent, the patient was taken to the operating room and placed on the operating table in the supine position.  She had previously received prophylactic antibiotics and subcutaneous heparin for DVT prophylaxis in the pre-op holding area.  After induction of general endotracheal anesthesia by the anesthesiologist, the patient underwent placement of sequential compression devices, Foley catheter and an oro-gastric tube.  A timeout was confirmed by the surgery and anesthesia teams.  The patient was adequately padded at all pressure points and placed on a footboard to prevent slippage from the OR table during extremes of position during surgery.  She underwent a routine sterile prep and drape of her entire abdomen.    Next, A transverse incision was made under the left subcostal area and a 5mm optical viewing trocar was introduced into the peritoneal cavity.  Pneumoperitoneum was applied with a high flow and low pressure. A laparoscope was inserted to confirm placement. A extraperitoneal block was then placed at the lateral abdominal wall using exparel diluted with marcaine . 5 additional trocars were placed: 1 5mm trocar to the left of the midline. 1 additional 5mm trocar in the left lateral area, 1 12mm trocar in the right mid abdomen, and 1 5mm trocar in the right subcostal area.  The greater omentum was flipped over the transverse colon and under the left lobe of the liver. The ligament of trietz was identified. 40cm of jejunum was measured starting from the ligament of Trietz. The mesentery was checked to ensure mobility. Next, a 60mm 2-54mm tristapler was used to divide the jejunum at this location. The harmonic scalpel was used to divide the mesentery down to the origin. A 1/2" penrose was sutured to the distal side. 100cm of jejunum was measured starting at the division. 2-0 silk was used to appose the biliary limb to the 100cm mark of jejunum in 2 places. Enterotomies were made in the biliary and common channels and a 60mm 2-3 tristapler was used to create the J-J anastomosis. A 2-0 silk was used to appose the enterotomy edges and a 2-3 tristapler was used to close the enterotomy. An anti-obstruction 2-0 silk suture was placed. Next, the mesenteric defect was closed with a 2-0 silk in running fashion.The J-J appeared patent and in neutral position.  Next, the omentum was divided using the Harmonic scalpel. The patient was placed in steep Reverse Trendelenberg position. A Nathanson retracted was placed through a subxiphoid incision and used to retract the liver. The fat pad over the fundus was incised to free the fundus. Next, a position along the lesser curve 6cm from  GE junction was identified. The pars flaccida was entered and the fat over the lesser curve divided to enter the lesser sac. 3 60mm 3-62mm tristaple firings were peformed to create a 6cm  pouch. The Roux limb was identified using the placed penrose and brought up to the stomach in antecolic fashion. The limb was inspected to ensure a neutral position. A 2-0 vicryl suture was then used to create a posterior layer connecting the stomach to the Roux limb jejunum in running fashion. Next cautery was used to create an enterotomy along the medial aspect of this suture line and Harmonic scalpel used to create gastotomy. A 45mm 3-41mm tristapler was then used to create a 30mm anastomosis. 2 2-0 vicryl sutures were used in running fashion to close the gastrotomy. Finally, a 2-0 vicryl suture was used to close an anterior layer of stomach and jejunum over the anastomosis in running fashion. The penrose was removed from the Roux limb. A 2-0 vicryl was used to appose the transverse mesocolon to the mesentery of the Roux limb.  The assistant then went and performed an upper endoscopy and leak test. No bubbles were seen and the pouch and limb distended appropriately. The limb and pouch were deflated, the endoscope was removed. Hemostasis was ensured. Pneumoperitoneum was evacuated, all ports were removed and all incisions closed with 4-0 monocryl suture in subcuticular fashion. Steristrips and bandaids were put in place for dressing. The patient awoke from anesthesia and was brought to pacu in stable condition. All counts were correct.  Specimens:  None  Local Anesthesia: 50 ml Exparel: 0.5% Marcaine Mix  Post-Op Plan:       Pain Management: PO, prn      Antibiotics: Prophylactic      Anticoagulation: Prophylactic, Starting now      Post Op Studies/Consults: Not applicable      Intended Discharge: within 48h      Intended Outpatient Follow-Up: Two Week      Intended Outpatient Studies: Not Applicable      Other: Not Applicable   De Blanch Jazmarie Biever

## 2017-07-18 NOTE — Interval H&P Note (Signed)
History and Physical Interval Note:  07/18/2017 6:58 AM  Jamie Mcknight  has presented today for surgery, with the diagnosis of Morbid Obesity, PCOS  The various methods of treatment have been discussed with the patient and family. After consideration of risks, benefits and other options for treatment, the patient has consented to  Procedure(s): LAPAROSCOPIC ROUX-EN-Y GASTRIC BYPASS WITH UPPER ENDOSCOPY (N/A) as a surgical intervention .  The patient's history has been reviewed, patient examined, no change in status, stable for surgery.  I have reviewed the patient's chart and labs.  Questions were answered to the patient's satisfaction.     De Blanch Lancelot Alyea

## 2017-07-18 NOTE — Progress Notes (Signed)
Dr. Sampson Goon  In to see patient- made aware of patient's blood pressures- blood pressure cuff changed location

## 2017-07-18 NOTE — Transfer of Care (Signed)
Immediate Anesthesia Transfer of Care Note  Patient: Jamie Mcknight  Procedure(s) Performed: Procedure(s): LAPAROSCOPIC ROUX-EN-Y GASTRIC BYPASS WITH UPPER ENDOSCOPY (N/A)  Patient Location: PACU  Anesthesia Type:General  Level of Consciousness: awake  Airway & Oxygen Therapy: Patient Spontanous Breathing and Patient connected to face mask oxygen  Post-op Assessment: Report given to RN and Post -op Vital signs reviewed and stable  Post vital signs: Reviewed and stable  Last Vitals:  Vitals:   07/18/17 0546  BP: (!) 142/103  Pulse: 81  Resp: 16  Temp: 37.3 C  SpO2: 100%    Last Pain:  Vitals:   07/18/17 0546  TempSrc: Oral      Patients Stated Pain Goal: 4 (07/18/17 0601)  Complications: No apparent anesthesia complications

## 2017-07-18 NOTE — Progress Notes (Signed)
Postoperative day goal education complete.  Discussed with patient frequent ambulation x 4/day, IS use, and diet progression.  Patient in chair at this time.  No questions.  Family at bedside.

## 2017-07-18 NOTE — Op Note (Signed)
Jamie Mcknight [id number] 1986-04-07 07/18/2017  Preoperative diagnosis: morbid obesity  Postoperative diagnosis: Same   Procedure: Upper endoscopy   Surgeon: Atilano Ina M.D., FACS   Anesthesia: Gen.   Indications for procedure: 31 y.o. yo female undergoing a laparoscopic roux en y gastric bypass and an upper endoscopy was requested to evaluate the anastomosis.  Description of procedure: After we have completed the new gastrojejunostomy, I scrubbed out and obtained the Olympus endoscope. I gently placed endoscope in the patient's oropharynx and gently glided it down the esophagus without any difficulty under direct visualization. Once I was in the gastric pouch, I insufflated the pouch was air. The pouch was approximately 4 cm in size. I was able to cannulate and advanced the scope through the gastrojejunostomy. Dr.Kinsinger had placed saline in the upper abdomen. Upon further insufflation of the gastric pouch there was no evidence of bubbles. Upon further inspection of the gastric pouch, the mucosa appeared normal. There is no evidence of any mucosal abnormality. The gastric pouch and Roux limb were decompressed. The width of the gastrojejunal anastomosis was at least 2.5 cm. The scope was withdrawn. The patient tolerated this portion of the procedure well. Please see Dr Guerry Minors operative note for details regarding the laparoscopic roux-en-y gastric bypass.  Jamie Mcknight. [name], MD, FACS General, Bariatric, & Minimally Invasive Surgery [facility], [place]

## 2017-07-18 NOTE — Discharge Instructions (Signed)

## 2017-07-18 NOTE — Progress Notes (Signed)
CBC [Hgb. And Hct.] and Serum Creatinine drawn by lab.

## 2017-07-18 NOTE — Anesthesia Postprocedure Evaluation (Signed)
Anesthesia Post Note  Patient: Jamie Mcknight  Procedure(s) Performed: Procedure(s) (LRB): LAPAROSCOPIC ROUX-EN-Y GASTRIC BYPASS WITH UPPER ENDOSCOPY (N/A)     Patient location during evaluation: PACU Anesthesia Type: General Level of consciousness: awake and alert Pain management: pain level controlled Vital Signs Assessment: post-procedure vital signs reviewed and stable Respiratory status: spontaneous breathing, nonlabored ventilation, respiratory function stable and patient connected to nasal cannula oxygen Cardiovascular status: blood pressure returned to baseline and stable Postop Assessment: no apparent nausea or vomiting Anesthetic complications: no    Last Vitals:  Vitals:   07/18/17 1115 07/18/17 1151  BP: (!) 156/94 (!) 146/94  Pulse: 75 78  Resp: (!) 23 18  Temp: 36.8 C 36.9 C  SpO2: 100% 100%    Last Pain:  Vitals:   07/18/17 1045  TempSrc:   PainSc: 5                  Kennieth Rad

## 2017-07-19 ENCOUNTER — Encounter (HOSPITAL_COMMUNITY): Payer: Self-pay | Admitting: General Surgery

## 2017-07-19 LAB — COMPREHENSIVE METABOLIC PANEL
ALBUMIN: 3.8 g/dL (ref 3.5–5.0)
ALK PHOS: 94 U/L (ref 38–126)
ALT: 33 U/L (ref 14–54)
AST: 29 U/L (ref 15–41)
Anion gap: 9 (ref 5–15)
BUN: 13 mg/dL (ref 6–20)
CALCIUM: 9 mg/dL (ref 8.9–10.3)
CO2: 23 mmol/L (ref 22–32)
CREATININE: 0.68 mg/dL (ref 0.44–1.00)
Chloride: 107 mmol/L (ref 101–111)
GFR calc non Af Amer: >60 mL/min (ref >60)
GLUCOSE: 104 mg/dL — AB (ref 65–99)
Potassium: 3.8 mmol/L (ref 3.5–5.1)
SODIUM: 139 mmol/L (ref 135–145)
Total Bilirubin: 0.6 mg/dL (ref 0.3–1.2)
Total Protein: 7.1 g/dL (ref 6.5–8.1)

## 2017-07-19 LAB — CBC WITH DIFFERENTIAL/PLATELET
Basophils Absolute: 0 10*3/uL (ref 0.0–0.1)
Basophils Relative: 0 %
EOS ABS: 0 10*3/uL (ref 0.0–0.7)
Eosinophils Relative: 0 %
HEMATOCRIT: 38.2 % (ref 36.0–46.0)
HEMOGLOBIN: 13 g/dL (ref 12.0–15.0)
LYMPHS ABS: 3 10*3/uL (ref 0.7–4.0)
Lymphocytes Relative: 20 %
MCH: 28.4 pg (ref 26.0–34.0)
MCHC: 34 g/dL (ref 30.0–36.0)
MCV: 83.6 fL (ref 78.0–100.0)
Monocytes Absolute: 0.8 10*3/uL (ref 0.1–1.0)
Monocytes Relative: 5 %
NEUTROS ABS: 11.4 10*3/uL — AB (ref 1.7–7.7)
NEUTROS PCT: 75 %
Platelets: 284 10*3/uL (ref 150–400)
RBC: 4.57 MIL/uL (ref 3.87–5.11)
RDW: 13.6 % (ref 11.5–15.5)
WBC: 15.2 10*3/uL — AB (ref 4.0–10.5)

## 2017-07-19 MED ORDER — DIPHENHYDRAMINE HCL 12.5 MG/5ML PO ELIX
25.0000 mg | ORAL_SOLUTION | Freq: Once | ORAL | Status: AC
  Administered 2017-07-19: 25 mg via ORAL
  Filled 2017-07-19: qty 10

## 2017-07-19 NOTE — Discharge Summary (Signed)
Physician Discharge Summary  Jamie Mcknight ZOX:[id number] DOB: [birth year] DOA: 07/18/2017  PCP: Marcelo Baldy, PA-C  Admit date: 07/18/2017 Discharge date: 07/19/2017  Recommendations for Outpatient Follow-up:  1.  (include homehealth, outpatient follow-up instructions, specific recommendations for PCP to follow-up on, etc.)  Follow-up Information    Maria Gallicchio, De Blanch, MD. Go on 08/10/2017.   Specialty:  General Surgery Why:  at 4 Contact information: [address] [place] [address] [phone]        Amarie Tarte, De Blanch, MD Follow up.   Specialty:  General Surgery Contact information: [address] [place] [address] [place] [address] [phone]          Discharge Diagnoses:  Active Problems:   Morbid obesity [facility])   Surgical Procedure: Laparoscopic Sleeve Gastrectomy, upper endoscopy  Discharge Condition: Good Disposition: Home  Diet recommendation: Postoperative sleeve gastrectomy diet (liquids only)  Filed Weights   07/18/17 0601 07/19/17 0932  Weight: 122.7 kg (270 lb 6.4 oz) 122.4 kg (269 lb 14.4 oz)     Hospital Course:  The patient was admitted after undergoing Roux-en-Y gastric bypass. POD 0 she ambulated well. POD 1 she was started on the water diet protocol and tolerated 400 ml in the first shift. Once meeting the water amount she was advanced to bariatric protein shakes which they tolerated and were discharged home POD 1.  Treatments: surgery: Roux-en-Y gastric bypass  Discharge Instructions  Discharge Instructions    Ambulate hourly while awake    Complete by:  As directed    Call MD for:  difficulty breathing, headache or visual disturbances    Complete by:  As directed    Call MD for:  persistant dizziness or light-headedness    Complete by:  As directed    Call MD for:  persistant nausea and vomiting    Complete by:  As directed    Call MD for:  redness, tenderness, or signs of infection (pain, swelling,  redness, odor or green/yellow discharge around incision site)    Complete by:  As directed    Call MD for:  severe uncontrolled pain    Complete by:  As directed    Call MD for:  temperature >101 F    Complete by:  As directed    Diet bariatric full liquid    Complete by:  As directed    Discharge wound care:    Complete by:  As directed    Remove Bandaids tomorrow, ok to shower tomorrow. Steristrips may fall off in 1-3 weeks.   Incentive spirometry    Complete by:  As directed    Perform hourly while awake     Allergies as of 07/19/2017      Reactions   Dust Mite Extract Other (See Comments)   Runny nose, sneezing, dry throat, itchy eyes   Mold Extract [trichophyton] Other (See Comments)   Runny nose, sneezing, dry throat, itchy eyes   Codeine Phosphate    REACTION: questionable. Pt mom states that she can take vicodin.   Erythromycin Ethylsuccinate    REACTION: questionable      Medication List    TAKE these medications   albuterol 108 (90 Base) MCG/ACT inhaler Commonly known as:  PROVENTIL HFA;VENTOLIN HFA Inhale 1-2 puffs into the lungs every 4 (four) hours as needed for wheezing or shortness of breath.   NEXPLANON 68 MG Impl implant Generic drug:  etonogestrel 1 each by Subdermal route continuous.  Discharge Care Instructions        Start     Ordered   07/19/17 0000  Diet bariatric full liquid     07/19/17 1459   07/19/17 0000  Ambulate hourly while awake     07/19/17 1459   07/19/17 0000  Incentive spirometry    Comments:  Perform hourly while awake   07/19/17 1459   07/19/17 0000  Discharge wound care:    Comments:  Remove Bandaids tomorrow, ok to shower tomorrow. Steristrips may fall off in 1-3 weeks.   07/19/17 1459   07/19/17 0000  Call MD for:  temperature >101 F     07/19/17 1459   07/19/17 0000  Call MD for:  persistant nausea and vomiting     07/19/17 1459   07/19/17 0000  Call MD for:  severe uncontrolled pain     07/19/17 1459    07/19/17 0000  Call MD for:  redness, tenderness, or signs of infection (pain, swelling, redness, odor or green/yellow discharge around incision site)     07/19/17 1459   07/19/17 0000  Call MD for:  difficulty breathing, headache or visual disturbances     07/19/17 1459   07/19/17 0000  Call MD for:  persistant dizziness or light-headedness     07/19/17 1459     Follow-up Information    Jamisha Hoeschen, De Blanch, MD. Go on 08/10/2017.   Specialty:  General Surgery Why:  at 4 Contact information: [address] [place] [address] [phone]        Veroncia Jezek, De Blanch, MD Follow up.   Specialty:  General Surgery Contact information: [address] [place] [address] [place] [address] [phone]            The results of significant diagnostics from this hospitalization (including imaging, microbiology, ancillary and laboratory) are listed below for reference.    Significant Diagnostic Studies: No results found.  Labs: Basic Metabolic Panel:  Recent Labs Lab 07/18/17 0600 07/18/17 1030 07/19/17 1157  NA 141  --  139  K 4.5  --  3.8  CL 110  --  107  CO2 24  --  23  GLUCOSE 107*  --  104*  BUN 16  --  13  CREATININE 0.74 0.73 0.68  CALCIUM 8.9  --  9.0   Liver Function Tests:  Recent Labs Lab 07/18/17 0600 07/19/17 1157  AST 19 29  ALT 19 33  ALKPHOS 116 94  BILITOT 0.5 0.6  PROT 7.3 7.1  ALBUMIN 3.9 3.8    CBC:  Recent Labs Lab 07/13/17 0836 07/18/17 1030 07/19/17 1157  WBC 10.6* 16.0* 15.2*  NEUTROABS  --   --  11.4*  HGB 14.5 13.5 13.0  HCT 41.3 39.3 38.2  MCV 82.8 84.9 83.6  PLT 257 250 284    CBG:  Recent Labs Lab 07/18/17 0542  GLUCAP 104*    Active Problems:   Morbid obesity (HCC)   Time coordinating discharge: <53min

## 2017-07-19 NOTE — Progress Notes (Signed)
Patient being discharged home with spouse.  Discharge instructions given to patient and spouse, pt verbalized understanding.  Quisha Mabie RN

## 2017-07-19 NOTE — Progress Notes (Signed)
Patient with complaints of feeling "flushed".  Patient assessed, VSS, no shortness of breath or pain.  Patient with redness to face and mid chest.  MD made aware orders given for benadryl po x1, will continue to monitor.    Tameia Rafferty RN

## 2017-07-19 NOTE — Progress Notes (Signed)
Patient alert and oriented, Post op day 1.  Provided support and encouragement.  Encouraged pulmonary toilet, ambulation and small sips of liquids.  Completed 12 ounces of clear fluids and began protein shakes.  Sitting in the chair this am after ambulating 4 laps in the hallway.  All questions answered.  Will continue to monitor.

## 2017-07-19 NOTE — Progress Notes (Signed)
Nutrition Education Note  RD provided diet education per DROP protocol.   Discussed 2 week post op diet with pt. Emphasized that liquids must be non carbonated, non caffeinated, and sugar free. Fluid goals discussed. Pt remembered goal. Pt to follow up with outpatient bariatric RD for further diet progression after 2 weeks. Multivitamins and minerals also reviewed.  Pt reports feeling a lot better when she stands up and walks, reports walking 9 laps already this morning. Pt reports taking fluids down well. Pt reports having Muscle Milk at home waiting. Pt reports needing to order her multivitamin but states she has her calcium chews at home already.  Teach back method used, pt expressed understanding, expect good compliance.   Diet: First 2 Weeks  You will see the nutritionist about two (2) weeks after your surgery. The nutritionist will increase the types of foods you can eat if you are handling liquids well:  If you have severe vomiting or nausea and cannot handle clear liquids lasting longer than 1 day, call your surgeon  Protein Shake  Drink at least 2 ounces of shake 5-6 times per day  Each serving of protein shakes (usually 8 - 12 ounces) should have a minimum of:  15 grams of protein  And no more than 5 grams of carbohydrate  Goal for protein each day:  Men = 80 grams per day  Women = 60 grams per day  Protein powder may be added to fluids such as non-fat milk or Lactaid milk or Soy milk (limit to 35 grams added protein powder per serving)   Hydration  Slowly increase the amount of water and other clear liquids as tolerated (See Acceptable Fluids)  Slowly increase the amount of protein shake as tolerated  Sip fluids slowly and throughout the day  May use sugar substitutes in small amounts (no more than 6 - 8 packets per day; i.e. Splenda)   Fluid Goal  The first goal is to drink at least 8 ounces of protein shake/drink per day (or as directed by the nutritionist); some examples  of protein shakes are Premier Protein, [employer], Dillard's, EAS Edge HP, and Unjury. See handout from pre-op Bariatric Education Class:  Slowly increase the amount of protein shake you drink as tolerated  You may find it easier to slowly sip shakes throughout the day  It is important to get your proteins in first  Your fluid goal is to drink 64 - 100 ounces of fluid daily  It may take a few weeks to build up to this  32 oz (or more) should be clear liquids  And  32 oz (or more) should be full liquids (see below for examples)  Liquids should not contain sugar, caffeine, or carbonation   Clear Liquids:  Water or Sugar-free flavored water (i.e. Fruit H2O, Propel)  Decaffeinated coffee or tea (sugar-free)  Crystal Lite, Wyler's Lite, Minute Maid Lite  Sugar-free Jell-O  Bouillon or broth  Sugar-free Popsicle: *Less than 20 calories each; Limit 1 per day   Full Liquids:  Protein Shakes/Drinks + 2 choices per day of other full liquids  Full liquids must be:  No More Than 12 grams of Carbs per serving  No More Than 3 grams of Fat per serving  Strained low-fat cream soup  Non-Fat milk  Fat-free Lactaid Milk  Sugar-free yogurt (Dannon Lite & Fit, [place] yogurt, Oikos Zero)   [place], [place], [place], [place] 07/19/2017 12:29 PM

## 2017-07-19 NOTE — Progress Notes (Signed)
Patient alert and oriented, pain is controlled. Patient is tolerating fluids, advanced to protein shake today, patient is tolerating well. Reviewed Gastric Bypass discharge instructions with patient and patient is able to articulate understanding. Provided information on BELT program, Support Group and [facility] outpatient pharmacy. All questions answered, will continue to monitor.    

## 2017-07-21 ENCOUNTER — Telehealth (HOSPITAL_COMMUNITY): Payer: Self-pay

## 2017-07-21 NOTE — Telephone Encounter (Signed)
Made discharge phone call to patient per DROP protocol. Asking the following questions.    1. Do you have someone to care for you now that you are home?  independent 2. Are you having pain now that is not relieved by your pain medication?  No pain medication 3. Are you able to drink the recommended daily amount of fluids (48 ounces minimum/day) and protein (60-80 grams/day) as prescribed by the dietitian or nutritional counselor?  45 grams of protein, 32 ounces of fluid, we discussed increasing fluids to 42  4. Are you taking the vitamins and minerals as prescribed?  No problems 5. Do you have the "on call" number to contact your surgeon if you have a problem or question?  yes 6. Are your incisions free of redness, swelling or drainage? (If steri strips, address that these can fall off, shower as tolerated) look good 7. Have your bowels moved since your surgery?  If not, are you passing gas?  yes 8. Are you up and walking 3-4 times per day?  yes 9. Were you provided your discharge medications before your surgery or before you were discharged from the hospital and are you taking them without problem?  yes

## 2017-07-25 ENCOUNTER — Telehealth (HOSPITAL_COMMUNITY): Payer: Self-pay

## 2017-07-26 NOTE — Telephone Encounter (Signed)
follow up for fluid intake, patient getting 64 ounces of  fluid in a day.

## 2017-08-02 ENCOUNTER — Ambulatory Visit: Payer: BC Managed Care – PPO

## 2017-08-03 ENCOUNTER — Encounter: Payer: Self-pay | Admitting: Registered"

## 2017-08-03 ENCOUNTER — Encounter: Payer: BC Managed Care – PPO | Attending: General Surgery | Admitting: Registered"

## 2017-08-03 DIAGNOSIS — Z6841 Body Mass Index (BMI) 40.0 and over, adult: Secondary | ICD-10-CM | POA: Diagnosis not present

## 2017-08-03 DIAGNOSIS — Z713 Dietary counseling and surveillance: Secondary | ICD-10-CM | POA: Insufficient documentation

## 2017-08-03 DIAGNOSIS — J45909 Unspecified asthma, uncomplicated: Secondary | ICD-10-CM | POA: Insufficient documentation

## 2017-08-03 DIAGNOSIS — F419 Anxiety disorder, unspecified: Secondary | ICD-10-CM | POA: Insufficient documentation

## 2017-08-03 DIAGNOSIS — F329 Major depressive disorder, single episode, unspecified: Secondary | ICD-10-CM | POA: Insufficient documentation

## 2017-08-03 DIAGNOSIS — Z79899 Other long term (current) drug therapy: Secondary | ICD-10-CM | POA: Insufficient documentation

## 2017-08-03 DIAGNOSIS — E669 Obesity, unspecified: Secondary | ICD-10-CM

## 2017-08-03 NOTE — Progress Notes (Signed)
Bariatric Class:  Appt start time: 4:15 end time:  4:45  2 Week Post-Operative Nutrition Class  Patient was seen on 08/04/2017 for Post-Operative Nutrition [facility].   Surgery date: 07/18/2017 Surgery type: Sleeve gastrectomy Start weight at [facility]: 269.7 lbs Weight today: 251.0 lbs Weight change: 18 lbs loss   Pt states she has not had any complications. Pt states she has tried mashed potatoes and ground meat to have some other options prior to returning to working as Pharmacist, hospital 1 week post-op. Pt states she tolerated food well.    TANITA  BODY COMP RESULTS  08/04/2017   BMI (kg/m^2) 44.5   Fat Mass (lbs) 119.4   Fat Free Mass (lbs) 131.6   Total Body Water (lbs) 97.2   The following the learning objectives were met by the patient during this course:  Identifies Phase 3A (Soft, High Proteins) Dietary Goals and will begin from 2 weeks post-operatively to 2 months post-operatively  Identifies appropriate sources of fluids and proteins   States protein recommendations and appropriate sources post-operatively  Identifies the need for appropriate texture modifications, mastication, and bite sizes when consuming solids  Identifies appropriate multivitamin and calcium sources post-operatively  Describes the need for physical activity post-operatively and will follow MD recommendations  States when to call healthcare provider regarding medication questions or post-operative complications  Handouts given during class include:  Phase 3A: Soft, High Protein Diet Handout  Follow-Up Plan: Patient will follow-up at [facility] in 6 weeks for 2 month post-op nutrition visit for diet advancement per MD.

## 2017-09-14 ENCOUNTER — Ambulatory Visit: Payer: BC Managed Care – PPO | Admitting: Registered"

## 2017-09-21 ENCOUNTER — Encounter: Payer: BC Managed Care – PPO | Attending: General Surgery | Admitting: Registered"

## 2017-09-21 ENCOUNTER — Encounter: Payer: Self-pay | Admitting: Registered"

## 2017-09-21 DIAGNOSIS — Z79899 Other long term (current) drug therapy: Secondary | ICD-10-CM | POA: Diagnosis not present

## 2017-09-21 DIAGNOSIS — Z6841 Body Mass Index (BMI) 40.0 and over, adult: Secondary | ICD-10-CM | POA: Insufficient documentation

## 2017-09-21 DIAGNOSIS — Z713 Dietary counseling and surveillance: Secondary | ICD-10-CM | POA: Diagnosis not present

## 2017-09-21 DIAGNOSIS — J45909 Unspecified asthma, uncomplicated: Secondary | ICD-10-CM | POA: Insufficient documentation

## 2017-09-21 DIAGNOSIS — F419 Anxiety disorder, unspecified: Secondary | ICD-10-CM | POA: Insufficient documentation

## 2017-09-21 DIAGNOSIS — F329 Major depressive disorder, single episode, unspecified: Secondary | ICD-10-CM | POA: Diagnosis not present

## 2017-09-21 DIAGNOSIS — E669 Obesity, unspecified: Secondary | ICD-10-CM

## 2017-09-21 NOTE — Progress Notes (Signed)
Follow-up visit:  8 Weeks Post-Operative Sleeve gastrectomy Surgery  Medical Nutrition Therapy:  Appt start time: 4:15 end time:  5:00.  Primary concerns today: Post-operative Bariatric Surgery Nutrition Management.  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 Pt states she has tried mashed potatoes and ground meat to have some other options prior to returning to working as Runner, broadcasting/film/videoteacher 1 week post-op. Pt states she tolerated food well.    TANITA  BODY COMP RESULTS  08/04/2017 09/21/2017   BMI (kg/m^2) 44.5 Pt declined   Fat Mass (lbs) 119.4    Fat Free Mass (lbs) 131.6    Total Body Water (lbs) 97.2     Pt states was put on anxiety medication and nausea medication due to having a nervous stomach and an increase in stomach acid. Pt states this was during the time she was preparing for her wedding and she was getting anxious easily. Pt states she does not have time to eat breakfast or time to sit and eat before work. Pt states she does not feel snacky often. Pt states sparkling water does not cause issues. Pt states she tracks fluid and protein intake using Barit[employer] App. Pt states she averages between 30-35 ounces of fluid on busy days and 60-65 ounces on other days. Pt states sometimes she eats 40-45 grams of protein a day, sometimes eats 60-65 grams.   After asking pt about protein intake and diet recall protein amount, pt states she cannot remember last time she has tracked food or fluid intake using  Barit[employer] App. Pt states she uses it more to keep track of weight, not protein or fluid intake. Pt states she does not know what is happening with her life right now due to being in the process of moving and having other things on her mind concerning her family. Pt states she is looking into a mental health professional but can't afford it right now.   Preferred Learning Style:   No preference indicated   Learning Readiness:   Not ready  Contemplating  Ready  Change in progress  24-hr recall: B (AM): sometimes skips; cheese stick (6g) Snk (AM): sometimes cheese stick (6g)  L (PM): 1 oz canned chicken (7g), with mayo and lettuce Snk (PM): sometimes green beans  D (PM): green beans, 1 oz ham (7g), broccoli, squash, mashed potatoes, deviled egg Snk (PM): none  Fluid intake: water with MIO, sparkling water; 64+ ounces most days Estimated total protein intake: ~26 grams    Medications: See list Supplementation: [facility] MVI capsule + 2 Ca supplements  Using straws: no Drinking while eating: sometimes, only it feels like something is getting stuck Having you been chewing well: yes Chewing/swallowing difficulties: no Changes in vision: no Changes to mood/headaches: no, no Hair loss/Changes to skin/Changes to nails: a little, no, no Any difficulty focusing or concentrating: distracted but has family issues that are on her mind Sweating: no Dizziness/Lightheaded: no, no Palpitations: no  Carbonated beverages: yes, sparkling water N/V/D/C/GAS: yes from  nervous stomach prior to wedding, no, no, no, no Abdominal Pain: no Dumping syndrome: no Last Lap-Band fill: N/A  Recent physical activity:  Core, upper body, low-impact ab exercises, legs, pilates, yoga 30 min, 7 days/week  Progress Towards Goal(s):  In progress.  Handouts given during visit include:  Phase 3B: High protein + NS vegetables   Nutritional Diagnosis:  NI-5.7.1 Inadequate protein intake As related to  bariatric surgery post-op recommendations.  As evidenced by pt report of less than 60 grams of protein daily.    Intervention:  Nutrition education and counseling. Goals:  Follow Phase 3B: High Protein + Non-Starchy Vegetables  Eat 3-6 small meals/snacks, every 3-5 hrs  Increase lean protein foods to meet 60g goal  Increase fluid intake to 64oz +  Avoid drinking 15 minutes before, during and 30 minutes after eating  Aim for >30 min of physical activity daily - Take 3 calcium supplements instead of 2.  - Aim to track food and fluid intake using Barit[employer] App.   Teaching Method Utilized:  Visual Auditory Hands on  Barriers to learning/adherence to lifestyle change: contemplative stage of change  Demonstrated degree of understanding via:  Teach Back   Monitoring/Evaluation:  Dietary intake, exercise, and body weight. Follow up in 2 months for 4 month post-op visit.

## 2017-09-21 NOTE — Patient Instructions (Addendum)
Goals:  Follow Phase 3B: High Protein + Non-Starchy Vegetables  Eat 3-6 small meals/snacks, every 3-5 hrs  Increase lean protein foods to meet 60g goal  Increase fluid intake to 64oz +  Avoid drinking 15 minutes before, during and 30 minutes after eating  Aim for >30 min of physical activity daily  - Take 3 calcium supplements instead of 2.   - Aim to track food and fluid intake using Barit[employer] App.

## 2017-11-11 ENCOUNTER — Other Ambulatory Visit: Payer: Self-pay

## 2017-11-11 ENCOUNTER — Encounter: Payer: Self-pay | Admitting: *Deleted

## 2017-11-11 ENCOUNTER — Emergency Department: Payer: BC Managed Care – PPO

## 2017-11-11 ENCOUNTER — Emergency Department
Admission: EM | Admit: 2017-11-11 | Discharge: 2017-11-11 | Disposition: A | Discharge status: Home / Self Care | Payer: BC Managed Care – PPO | Attending: Emergency Medicine | Admitting: Emergency Medicine

## 2017-11-11 DIAGNOSIS — N201 Calculus of ureter: Secondary | ICD-10-CM | POA: Diagnosis not present

## 2017-11-11 DIAGNOSIS — J45909 Unspecified asthma, uncomplicated: Secondary | ICD-10-CM | POA: Insufficient documentation

## 2017-11-11 DIAGNOSIS — R109 Unspecified abdominal pain: Secondary | ICD-10-CM | POA: Diagnosis present

## 2017-11-11 DIAGNOSIS — R319 Hematuria, unspecified: Secondary | ICD-10-CM | POA: Insufficient documentation

## 2017-11-11 LAB — URINALYSIS, COMPLETE (UACMP) WITH MICROSCOPIC
BACTERIA UA: NONE SEEN
Bilirubin Urine: NEGATIVE
GLUCOSE, UA: NEGATIVE mg/dL
KETONES UR: 20 mg/dL — AB
Leukocytes, UA: NEGATIVE
Nitrite: NEGATIVE
PROTEIN: 30 mg/dL — AB
Specific Gravity, Urine: 1.03 (ref 1.005–1.030)
pH: 5 (ref 5.0–8.0)

## 2017-11-11 LAB — POC URINE PREG, ED: PREG TEST UR: NEGATIVE

## 2017-11-11 MED ORDER — OXYCODONE HCL 5 MG PO TABS: 2.5000 mg | ORAL_TABLET | ORAL | 0 refills | Status: DC | PRN

## 2017-11-11 MED ORDER — OXYCODONE-ACETAMINOPHEN 5-325 MG PO TABS
1.0000 | ORAL_TABLET | Freq: Once | ORAL | Status: DC
  Filled 2017-11-11: qty 1

## 2017-11-11 NOTE — ED Triage Notes (Signed)
PT to ED from [facility] reporting left sided flank pain that began suddenly in the middle of the night the patient reports she has not been able to urinate very much today and reports a burning sensation when attempting to urinate. Pt reports she has had a kidney stone in the past and verbalized," do not remember it hurting this badly"   Pt denies nausea or vomiting. No diarrhea reported. Afebrile in triage.

## 2017-11-11 NOTE — ED Notes (Signed)
FIRST NURSE NOTE:  Pt from PCP's office, c/o left flank pain + N/V 10/10 pain per PCP.

## 2017-11-11 NOTE — ED Provider Notes (Signed)
[facility] Emergency Department Provider Note  ____________________________________________  Time seen: Approximately 2:09 PM  I have reviewed the triage vital signs and the nursing notes.   HISTORY  Chief Complaint Flank Pain    HPI Jamie Mcknight is a 32 y.o. female who complains of left flank pain that started yesterday. Constant, waxing and waning, sharp, nonradiating, no aggravating or alleviating factors, severe. She does also have dysuria and urinary frequency. No fevers or chills or sweats.     Past Medical History:  Diagnosis Date  . Anxiety   . Depression   . Kidney stone   . Morbid obesity (HCC)   . Polycystic ovarian disease      Patient Active Problem List   Diagnosis Date Noted  . Morbid obesity (HCC) 07/18/2017  . Viral gastroenteritis 10/26/2012  . POLYCYSTIC OVARIAN DISEASE 01/23/2007  . ANXIETY DEPRESSION 01/23/2007  . ASTHMA, INTERMITTENT, MILD 10/25/1996  . ALLERGY 10/25/1996     Past Surgical History:  Procedure Laterality Date  . GASTRIC ROUX-EN-Y N/A 07/18/2017   Procedure: LAPAROSCOPIC ROUX-EN-Y GASTRIC BYPASS WITH UPPER ENDOSCOPY;  Surgeon: Sheliah HatchKinsinger, De BlanchLuke Aaron, MD;  Location: [facility];  Service: General;  Laterality: N/A;  . KIDNEY STONE SURGERY       Prior to Admission medications   Medication Sig Start Date End Date Taking? Authorizing Provider  albuterol (PROVENTIL HFA;VENTOLIN HFA) 108 (90 Base) MCG/ACT inhaler Inhale 1-2 puffs into the lungs every 4 (four) hours as needed for wheezing or shortness of breath.    [provider]  etonogestrel (NEXPLANON) 68 MG IMPL implant 1 each by Subdermal route continuous.    [provider]     Allergies Dust mite extract; Mold extract [trichophyton]; Codeine phosphate; and Erythromycin ethylsuccinate   Family History  Problem Relation Age of Onset  . Diabetes Other   . Asthma Other   . Cancer Other   . Hypertension Other   . Stroke Other      Social History Social History   Tobacco Use  . Smoking status: Never Smoker  . Smokeless tobacco: Never Used  Substance Use Topics  . Alcohol use: Yes    Comment: occ  . Drug use: No    Review of Systems  Constitutional:   No fever or chills.   Cardiovascular:   No chest pain or syncope. Respiratory:   No dyspnea or cough. Gastrointestinal:  left leg pain as above without vomiting and diarrhea.  Musculoskeletal:   Negative for focal pain or swelling All other systems reviewed and are negative except as documented above in ROS and HPI.  ____________________________________________   PHYSICAL EXAM:  VITAL SIGNS: ED Triage Vitals  Enc Vitals Group     BP 11/11/17 1110 131/82     Pulse Rate 11/11/17 1110 61     Resp 11/11/17 1110 16     Temp --      Temp src --      SpO2 11/11/17 1110 100 %     Weight 11/11/17 1113 208 lb (94.3 kg)     Height 11/11/17 1113 5\' 5"  (1.651 m)     Head Circumference --      Peak Flow --      Pain Score 11/11/17 1113 5     Pain Loc --      Pain Edu? --      Excl. in GC? --     Vital signs reviewed, nursing assessments reviewed.   Constitutional:   Alert and oriented.  Well appearing and in no distress. Eyes:   No scleral icterus.  EOMI. No nystagmus. No conjunctival pallor. PERRL. ENT   Head:   Normocephalic and atraumatic.   Nose:   No congestion/rhinnorhea.    Mouth/Throat:   MMM, no pharyngeal erythema. No peritonsillar mass.    Neck:   No meningismus. Full ROM. Hematological/Lymphatic/Immunilogical:   No cervical lymphadenopathy. Cardiovascular:   RRR. Symmetric bilateral radial and DP pulses.  No murmurs.  Respiratory:   Normal respiratory effort without tachypnea/retractions. Breath sounds are clear and equal bilaterally. No wheezes/rales/rhonchi. Gastrointestinal:   Soft with minimal left sided tenderness. Non distended. There is no CVA tenderness.  No rebound, rigidity, or guarding. Genitourinary:    deferred Musculoskeletal:   Normal range of motion in all extremities. No joint effusions.  No lower extremity tenderness.  No edema. Neurologic:   Normal speech and language.  Motor grossly intact. No acute focal neurologic deficits are appreciated.  Skin:    Skin is warm, dry and intact. No rash noted.  No petechiae, purpura, or bullae.  ____________________________________________    LABS (pertinent positives/negatives) (all labs ordered are listed, but only abnormal results are displayed) Labs Reviewed  URINALYSIS, COMPLETE (UACMP) WITH MICROSCOPIC - Abnormal; Notable for the following components:      Result Value   Color, Urine AMBER (*)    APPearance CLOUDY (*)    Hgb urine dipstick LARGE (*)    Ketones, ur 20 (*)    Protein, ur 30 (*)    Squamous Epithelial / LPF 0-5 (*)    All other components within normal limits  POC URINE PREG, ED   ____________________________________________   EKG    ____________________________________________    RADIOLOGY  Ct Renal Stone Study  Result Date: 11/11/2017 CLINICAL DATA:  Left flank pain EXAM: CT ABDOMEN AND PELVIS WITHOUT CONTRAST TECHNIQUE: Multidetector CT imaging of the abdomen and pelvis was performed following the standard protocol without IV contrast. COMPARISON:  09/07/2009 FINDINGS: Lower chest: No acute abnormality. Hepatobiliary: Unremarkable Pancreas: Unremarkable Spleen: Unremarkable Adrenals/Urinary Tract: No hydronephrosis. No renal calculus. New 1.6 x 1.1 cm left adrenal nodule. Right adrenal gland is unremarkable. Bladder is decompressed. There is a small calculus in the dependent portion of the left side of the bladder Stomach/Bowel: Postoperative changes from gastric bypass surgery. Normal appendix. No obvious mass in the colon. No evidence of small-bowel obstruction. Vascular/Lymphatic: No abnormal retroperitoneal adenopathy. No evidence of aortic aneurysm. Reproductive: Uterus and adnexa are within normal limits.  Other: No free fluid. Musculoskeletal: No vertebral compression deformity. IMPRESSION: There is a small calculus within the dependent portion of the left side of the bladder likely a recently passed calculus. Given the patient's history, this is probably a recently passed left ureteral calculus. New left adrenal nodule measuring 1.6 cm. MRI is recommended. Malignancy is not excluded Postoperative changes from gastric bypass surgery. No evidence of complication. Electronically Signed   By: Jolaine Click M.D.   On: 11/11/2017 13:42    ____________________________________________   PROCEDURES Procedures  ____________________________________________   DIFFERENTIAL DIAGNOSIS cystitis, pyelonephritis, ureterolithiasis with obstruction  CLINICAL IMPRESSION / ASSESSMENT AND PLAN / ED COURSE  Pertinent labs & imaging results that were available during my care of the patient were reviewed by me and considered in my medical decision making (see chart for details).   patient presents with left flank pain and urinary symptoms. Urinalysis shows hematuria, small amount of white blood cells but overall not consistent with urinary tract infection. Obtained a CT scan  to further discern between kidney stone versus urinary tract infection. CT does reveal a small stone that has now passed to the bladder. Patient is feeling better on reassessment, pain is only 3 out of 10. At this point we'll treat conservatively with pain control and follow-up. No antibiotics at this point, she feels improved. I told her she should expect her symptoms to resolve over the next 24 hours and if not she should seek further medical care. She was informed of the incidental finding of a adrenal nodule and the need to follow up with her primary care doctor for it to arrange an MRI.  Considering the patient's symptoms, medical history, and physical examination today, I have low suspicion for cholecystitis or biliary pathology, pancreatitis,  perforation or bowel obstruction, hernia, intra-abdominal abscess, AAA or dissection, volvulus or intussusception, mesenteric ischemia, or appendicitis.        ____________________________________________   FINAL CLINICAL IMPRESSION(S) / ED DIAGNOSES    Final diagnoses:  Left flank pain  Hematuria, unspecified type  Ureterolithiasis       Portions of this note were generated with dragon dictation software. Dictation errors may occur despite best attempts at proofreading.    Sharman Cheek, MD 11/11/17 [phone]

## 2017-11-11 NOTE — Discharge Instructions (Signed)
Your CT scan shows a small stone in the bladder which was likely causing your symptoms.  You should feel a lot better at this point going forward.  Your scan does show a 1.6cm mass in your left adrenal gland.  This should have a follow up MRI arranged by your primary care doctor.   CT summary: IMPRESSION:  There is a small calculus within the dependent portion of the left  side of the bladder likely a recently passed calculus. Given the  patient's history, this is probably a recently passed left ureteral  calculus.     New left adrenal nodule measuring 1.6 cm. MRI is recommended.  Malignancy is not excluded     Postoperative changes from gastric bypass surgery. No evidence of  complication.

## 2017-11-22 ENCOUNTER — Ambulatory Visit: Payer: BC Managed Care – PPO | Admitting: Registered"

## 2017-11-30 ENCOUNTER — Encounter: Payer: Self-pay | Admitting: Registered"

## 2017-11-30 ENCOUNTER — Encounter: Payer: BC Managed Care – PPO | Attending: General Surgery | Admitting: Registered"

## 2017-11-30 DIAGNOSIS — Z6841 Body Mass Index (BMI) 40.0 and over, adult: Secondary | ICD-10-CM | POA: Insufficient documentation

## 2017-11-30 DIAGNOSIS — Z713 Dietary counseling and surveillance: Secondary | ICD-10-CM | POA: Insufficient documentation

## 2017-11-30 DIAGNOSIS — Z79899 Other long term (current) drug therapy: Secondary | ICD-10-CM | POA: Diagnosis not present

## 2017-11-30 DIAGNOSIS — F419 Anxiety disorder, unspecified: Secondary | ICD-10-CM | POA: Insufficient documentation

## 2017-11-30 DIAGNOSIS — F329 Major depressive disorder, single episode, unspecified: Secondary | ICD-10-CM | POA: Diagnosis not present

## 2017-11-30 DIAGNOSIS — J45909 Unspecified asthma, uncomplicated: Secondary | ICD-10-CM | POA: Diagnosis not present

## 2017-11-30 DIAGNOSIS — E669 Obesity, unspecified: Secondary | ICD-10-CM

## 2017-11-30 NOTE — Progress Notes (Signed)
Follow-up visit: 4.5 Weeks Post-Operative Sleeve gastrectomy Surgery  Medical Nutrition Therapy:  Appt start time: 4:45 end time:  5:20.  Primary concerns today: Post-operative Bariatric Surgery Nutrition Management.  Non scale victories: can wear pants and a dress that hasn't been able to ear prior to, can keep up with 8th graders, able to keep up with dogs when taking them outside, keep thermostat at a higher temp  Surgery date: 07/18/2017 Surgery type: Sleeve gastrectomy Start weight at [facility]: 269.7 lbs Weight today: 204 lbs (pt report) Weight change: 24 lbs loss (using pt report of 204 lbs)  Total weight lost: 65.7 lbs Weight loss goal: reduce chances of diabetes and other health risks, healthy weight loss for childbearing   Pt states she has not had any complications. Pt states she has tried mashed potatoes and ground meat to have some other options prior to returning to working as Runner, broadcasting/film/video 1 week post-op. Pt states she tolerated food well.    TANITA  BODY COMP RESULTS  08/04/2017 09/21/2017 11/30/2017   BMI (kg/m^2) 44.5 Pt declined Pt declined   Fat Mass (lbs) 119.4     Fat Free Mass (lbs) 131.6     Total Body Water (lbs) 97.2     Pt states checked her weight this morning and didn't want to weight during today's appointment. Pt tates she has started taking zoloft; stress and anxiety was too much. Pt states she was in hospital recently for kidney stone. Pt states she weighs every 2-3 days. Pt states she recently had the flu, drunk protein shakes during that time. Pt reports eating break but has stopped. Pt states she tracks fluid and protein intake; 64+ ounces and 60 grams respectfully. Pt reports reading labels more often now. Pt states she was drinking sparkling water but stopped because it was too much. Pt states she makes her own butter, cooks less with bacon grease, and has started to can more. Pt states she is also doing well with  jerky now.   Pt states she is looking into a  mental health professional but can't afford it right now.   Preferred Learning Style:   No preference indicated   Learning Readiness:   Not ready  Contemplating  Ready  Change in progress  24-hr recall: B (AM): 1 egg omelette, mushroom, cheese (12g) or Yoplait yogurt with fruit (15g) Snk (AM): sometimes cheese stick (6g) or fruit or nuts (7g) or protein bar or jicama L (PM): 3 oz chicken (21g), vegetables Snk (PM): nuts D (PM): green beans, 1 oz ham (7g), broccoli, squash, mashed potatoes, deviled egg or 3 oz grilled fish (21g), broccoli Snk (PM): none  Fluid intake: water with MIO, unsweetened/herbal tea, chocolate almond/cashew milk (10g); ~64+ ounces most days Estimated total protein intake: ~60 grams    Medications: See list Supplementation: [facility] MVI capsule + 2 Ca supplements  Using straws: sometimes Drinking while eating: no Having you been chewing well: yes Chewing/swallowing difficulties: no Changes in vision: no Changes to mood/headaches: yes-taking zoloft, no Hair loss/Changes to skin/Changes to nails: yes more than normal, no, no Any difficulty focusing or concentrating: yes but states she hates her job Sweating: no Dizziness/Lightheaded: no, no Palpitations: no  Carbonated beverages: no N/V/D/C/GAS: no, no, no, sometimes when not getting enough fiber, no Abdominal Pain: no Dumping syndrome: no Last Lap-Band fill: N/A  Recent physical activity:  Core, upper body, low-impact ab exercises, legs, pilates, yoga 30 min, 7 days/week  Progress Towards Goal(s):  In progress.  Handouts given during visit include:  none   Nutritional Diagnosis:  [place]-3.3 Overweight/obesity related to past poor dietary habits and physical inactivity as evidenced by patient w/ recent sleeve gastrectomy surgery following dietary guidelines for continued weight loss.     Intervention:  Nutrition education and counseling. Goals: - Take 3 calcium supplements instead of  2.  - Take 3rd Calcium when leaving school during the week and set alarms on Baritastic App for weekends.  - Aim to keep snacks on hand especially on the weekends when on-the-go.    Teaching Method Utilized:  Visual Auditory Hands on  Barriers to learning/adherence to lifestyle change: contemplative stage of change  Demonstrated degree of understanding via:  Teach Back   Monitoring/Evaluation:  Dietary intake, exercise, and body weight. Follow up in 6 weeks for 6 month post-op visit.

## 2017-11-30 NOTE — Patient Instructions (Signed)
-   Take 3 calcium supplements instead of 2.   - Take 3rd Calcium when leaving school during the week and set alarms on Barit[employer] App for weekends.   - Aim to keep snacks on hand especially on the weekends when on-the-go.

## 2018-01-02 ENCOUNTER — Telehealth: Payer: Self-pay | Admitting: Registered"

## 2018-01-02 NOTE — Telephone Encounter (Signed)
Hi Jamie Mcknight,  I hope you're doing well! I understand your concerns. In order for me to best serve you, it may be best for you to come in for an appointment at your earliest convenience. By doing that, I can more accurately assess where you are, aim to meet your needs, and move forward from there. I would love to help and discuss tips that will work for you.   Jamie Capuchinonetta [name], MS, RD, LDN [facility]  Nutrition & Diabetes Education Services Registered Dietitian I Direct Dial: [phone]  Fax: [phone] Website: [email or web address]    From: [name]>  Sent: Thursday, December 29, 2017 12:04 PM To: Adela LankFloyd, [name]> Subject: [External Email]Re: [External Email]Gemini Loy Question  *Caution - External email - see footer for warnings* Couple Questions: 1.) I've been noticing that my stomach is changing a bit so I am trying to pay attention to what it needs. However, I cant seem to eat as much...so I really focusing on eating every 2-3 hours but eating snack sized things...like grazing more than actual meals. But it's getting harder to get all the protein. So I was wondering if this is normal and what are some protein packed things I can eat. I currently do small abouts of meat, cheese, nuts and super low sugar protein bars. Is that enough or is there something else.   2.) I have been working with my primary doctor about my medications. I stopped losing weight altogether with the zoloft I was taking. I never gained any.Marland Kitchen.Marland Kitchen.I just stopped losing. I have switched to welbutrin which she says has far less of a chance to cause weight gain. I'm honestly still a little worried about it. I was also wondering if there was a way to perhaps jump start my system again. I figure using meds like that and changing them like this can be tough on my system and I just want to get back to losing again in a healthy way.   Thanks for your help!!

## 2018-01-18 ENCOUNTER — Ambulatory Visit: Payer: BC Managed Care – PPO | Admitting: Registered"

## 2018-01-26 ENCOUNTER — Ambulatory Visit: Payer: BC Managed Care – PPO | Admitting: Registered"

## 2018-02-20 ENCOUNTER — Encounter: Payer: BC Managed Care – PPO | Attending: General Surgery | Admitting: Registered"

## 2018-02-20 ENCOUNTER — Encounter: Payer: Self-pay | Admitting: Registered"

## 2018-02-20 DIAGNOSIS — E669 Obesity, unspecified: Secondary | ICD-10-CM

## 2018-02-20 DIAGNOSIS — Z79899 Other long term (current) drug therapy: Secondary | ICD-10-CM | POA: Diagnosis not present

## 2018-02-20 DIAGNOSIS — F419 Anxiety disorder, unspecified: Secondary | ICD-10-CM | POA: Diagnosis not present

## 2018-02-20 DIAGNOSIS — J45909 Unspecified asthma, uncomplicated: Secondary | ICD-10-CM | POA: Insufficient documentation

## 2018-02-20 DIAGNOSIS — F329 Major depressive disorder, single episode, unspecified: Secondary | ICD-10-CM | POA: Insufficient documentation

## 2018-02-20 DIAGNOSIS — Z6841 Body Mass Index (BMI) 40.0 and over, adult: Secondary | ICD-10-CM | POA: Diagnosis not present

## 2018-02-20 DIAGNOSIS — Z713 Dietary counseling and surveillance: Secondary | ICD-10-CM | POA: Insufficient documentation

## 2018-02-20 NOTE — Progress Notes (Signed)
Follow-up visit: 7 Months Post-Operative Sleeve gastrectomy Surgery  Medical Nutrition Therapy:  Appt start time: 4:45 end time: 5:20.  Primary concerns today: Post-operative Bariatric Surgery Nutrition Management.  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, able to keep up with dogs when taking them outside, keeps thermostat at a higher temp, feels great, thinks that she looks great, can cross legs comfortably, can shop for adorable clothes   Surgery date: 07/18/2017 Surgery type: Sleeve gastrectomy Start weight at [facility]: 269.7 lbs Weight today: 192 lbs (pt report) Weight change: 12 lbs loss from 204 lbs (using pt reported weight from 11/30/2017)  Total weight lost: 77.7 lbs (using start weight at [facility] - pt reported weight for today) Weight loss goal: reduce chances of diabetes and other health risks, healthy weight loss for childbearing, ~150 lbs  Pt states she has not had any complications. Pt states she has tried mashed potatoes and ground meat to have some other options prior to returning to working as Runner, broadcasting/film/video 1 week post-op. Pt states she tolerated food well.    TANITA  BODY COMP RESULTS  08/04/2017 09/21/2017 11/30/2017 02/20/2018   BMI (kg/m^2) 44.5 Pt declined Pt declined Pt declined   Fat Mass (lbs) 119.4      Fat Free Mass (lbs) 131.6      Total Body Water (lbs) 97.2       Pt arrives and declines weight. Pt states life is stressful due to work and just life. Pt states things will be much better once school year ends for her in May. Pt states she has struggled with what to eat. Pt states she is playing around with keto and paleo diet information to help with low carbohydrate recipe ideas. Pt states she makes comfort breakfast foods sometimes. Pt states she is retaining a lot of fluid; states she took 1/2 her mom's fluid pill and it made her urinate a lot. Pt states she will talk with her PCP about this on  Wednesday. Pt states she is taking a dissolvable bariatric multivitamin and she checked the nutrition facts to verify adequacy. Pt states it is comparable to her previous approved bariatric multivitamin.   Pt tates she has started taking zoloft; stress and anxiety was too much. Pt states she was in hospital recently for kidney stone. Pt states she is looking into a mental health professional but can't afford it right now.   Preferred Learning Style:   No preference indicated   Learning Readiness:   Contemplating  Ready  Change in progress  24-hr recall: B (AM): fruit smoothie (unsweetened almond milk, protein protein-25g) or oatmeal with peanut butter   Snk (AM):  sometimes cheese stick (6g) or fruit or nuts (7g) or protein bar or jicama L (PM): 3 oz chicken (21g), vegetables or sandwich thins ([place] and cheese) Snk (PM): nuts D (PM): 3 oz chicken (21g), vegetables   Snk (PM): none  Fluid intake: water with MIO, unsweetened/herbal tea, chocolate almond/cashew milk (10g); ~64+ ounces most days Estimated total protein intake: ~60 grams    Medications: See list Supplementation: dissolvable Bariatric vitamin + 3 Ca supplements  Using straws: sometimes Drinking while eating: no Having you been chewing well: yes Chewing/swallowing difficulties: no Changes in vision: no Changes to mood/headaches: yes-taking zoloft, no Hair loss/Changes to skin/Changes to nails: yes more than normal, no, no Any difficulty focusing or concentrating: yes but states she hates her job Sweating:  no Dizziness/Lightheaded: no, no Palpitations: no  Carbonated beverages: no N/V/D/C/GAS: no, no, no, sometimes when not getting enough fiber, no Abdominal Pain: no Dumping syndrome: no Last Lap-Band fill: N/A  Recent physical activity:  Running on treadmill 30-40 min on weekends, weight resistance once/week  Progress Towards Goal(s):  In progress.  Handouts given during visit  include:  none   Nutritional Diagnosis:  [place]-3.3 Overweight/obesity related to past poor dietary habits and physical inactivity as evidenced by patient w/ recent sleeve gastrectomy surgery following dietary guidelines for continued weight loss.     Intervention:  Nutrition education and counseling. Pt was educated and counseled on the importance of taking adequate bariatric multivitamins, having consistent physical activity, as well as stress management.  Goals: - Aim to wake up at 5am at least 2 days/week to work out for 30 min.  - Continue to increase vegetable intake with meals and snacks.  - Check into [employer] program to attend for summer break. See handout.  - Contact [facility] for 6 month visit with surgeon.     Teaching Method Utilized:  Visual Auditory Hands on  Barriers to learning/adherence to lifestyle change: contemplative stage of change  Demonstrated degree of understanding via:  Teach Back   Monitoring/Evaluation:  Dietary intake, exercise, and body weight. Follow up in 6 weeks for 6 month post-op visit.

## 2018-02-20 NOTE — Patient Instructions (Addendum)
-   Aim to wake up at 5am at least 2 days/week to work out for 30 min.   - Continue to increase vegetable intake with meals and snacks.   - Check into [employer] program to attend for summer break. See handout.   - Contact [facility] for 6 month visit with surgeon.

## 2018-04-24 ENCOUNTER — Ambulatory Visit: Payer: BC Managed Care – PPO | Admitting: Registered"

## 2018-05-09 ENCOUNTER — Ambulatory Visit: Payer: BC Managed Care – PPO | Admitting: Registered"

## 2019-02-26 ENCOUNTER — Encounter (HOSPITAL_COMMUNITY): Payer: Self-pay

## 2019-06-05 ENCOUNTER — Other Ambulatory Visit: Payer: Self-pay

## 2019-06-06 ENCOUNTER — Ambulatory Visit: Payer: BC Managed Care – PPO | Admitting: Obstetrics & Gynecology

## 2019-06-06 ENCOUNTER — Encounter: Payer: Self-pay | Admitting: Obstetrics & Gynecology

## 2019-06-06 VITALS — BP 128/80 | Ht 63.0 in | Wt 199.0 lb

## 2019-06-06 DIAGNOSIS — E6609 Other obesity due to excess calories: Secondary | ICD-10-CM

## 2019-06-06 DIAGNOSIS — Z6835 Body mass index (BMI) 35.0-35.9, adult: Secondary | ICD-10-CM

## 2019-06-06 DIAGNOSIS — Z01419 Encounter for gynecological examination (general) (routine) without abnormal findings: Secondary | ICD-10-CM | POA: Diagnosis not present

## 2019-06-06 DIAGNOSIS — Z3046 Encounter for surveillance of implantable subdermal contraceptive: Secondary | ICD-10-CM | POA: Diagnosis not present

## 2019-06-06 NOTE — Progress Notes (Signed)
Jamie Mcknight 1986-10-14 [id number]   History:    33 y.o. G0 Married  RP:  Established patient presenting for annual gyn exam   HPI: Well on Nexplanon x 06/2017.  No BTB.  Intermittent pelvic pains which patient attributes to ovarian cyst rupture.  No ER presentation/No pelvic US.  No pelvic pain currently.  No pain with IC.  Urine/BMs normal.  Breasts normal.  Had Bariatric surgery 06/2017.  BMI currently 35.25.  Not exercising as much x [facility].  Health labs with Fam MD.  Past medical history,surgical history, family history and social history were all reviewed and documented in the EPIC chart.  Gynecologic History Patient's last menstrual period was 05/30/2019. Contraception: Nexplanon Last Pap: 12/2016. Results were: Negative Last mammogram: Never Bone Density: Never Colonoscopy: Never  Obstetric History OB History  Gravida Para Term Preterm AB Living  0 0 0 0 0 0  SAB TAB Ectopic Multiple Live Births  0 0 0 0 0     ROS: A ROS was performed and pertinent positives and negatives are included in the history.  GENERAL: No fevers or chills. HEENT: No change in vision, no earache, sore throat or sinus congestion. NECK: No pain or stiffness. CARDIOVASCULAR: No chest pain or pressure. No palpitations. PULMONARY: No shortness of breath, cough or wheeze. GASTROINTESTINAL: No abdominal pain, nausea, vomiting or diarrhea, melena or bright red blood per rectum. GENITOURINARY: No urinary frequency, urgency, hesitancy or dysuria. MUSCULOSKELETAL: No joint or muscle pain, no back pain, no recent trauma. DERMATOLOGIC: No rash, no itching, no lesions. ENDOCRINE: No polyuria, polydipsia, no heat or cold intolerance. No recent change in weight. HEMATOLOGICAL: No anemia or easy bruising or bleeding. NEUROLOGIC: No headache, seizures, numbness, tingling or weakness. PSYCHIATRIC: No depression, no loss of interest in normal activity or change in sleep pattern.     Exam:   BP 128/80  (BP Location: Right Arm, Patient Position: Sitting, Cuff Size: Normal)   Ht 5\' 3"  (1.6 m)   Wt 199 lb (90.3 kg)   LMP 05/30/2019   BMI 35.25 kg/m   Body mass index is 35.25 kg/m.  General appearance : Well developed well nourished female. No acute distress HEENT: Eyes: no retinal hemorrhage or exudates,  Neck supple, trachea midline, no carotid bruits, no thyroidmegaly Lungs: Clear to auscultation, no rhonchi or wheezes, or rib retractions  Heart: Regular rate and rhythm, no murmurs or gallops Breast:Examined in sitting and supine position were symmetrical in appearance, no palpable masses or tenderness,  no skin retraction, no nipple inversion, no nipple discharge, no skin discoloration, no axillary or supraclavicular lymphadenopathy Abdomen: no palpable masses or tenderness, no rebound or guarding Extremities: no edema or skin discoloration or tenderness  Pelvic: Vulva: Normal             Vagina: No gross lesions or discharge  Cervix: No gross lesions or discharge.  Pap reflex done.  Uterus  AV, normal size, shape and consistency, non-tender and mobile  Adnexa  Without masses or tenderness  Anus: Normal   Assessment/Plan:  33 y.o. female for annual exam   1. Encounter for routine gynecological examination with Papanicolaou smear of cervix Normal gynecologic exam.  Pap reflex done today.  Breast exam normal.  Health labs with family physician. - Pap IG w/ reflex to HPV when ASC-U  2. Encounter for surveillance of implantable subdermal contraceptive Well on Nexplanon since 06/2017.  3. Class 2 obesity due to excess calories without serious comorbidity with  body mass index (BMI) of 35.0 to 35.9 in adult Low calorie/carb diet such as [employer].  Aerobic physical activities 5 times a week and weightlifting every 2 days recommended.  Princess Bruins MD, 3:06 PM 06/06/2019

## 2019-06-10 ENCOUNTER — Encounter: Payer: Self-pay | Admitting: Obstetrics & Gynecology

## 2019-06-10 NOTE — Patient Instructions (Signed)
1. Encounter for routine gynecological examination with Papanicolaou smear of cervix Normal gynecologic exam.  Pap reflex done today.  Breast exam normal.  Health labs with family physician. - Pap IG w/ reflex to HPV when ASC-U  2. Encounter for surveillance of implantable subdermal contraceptive Well on Nexplanon since 06/2017.  3. Class 2 obesity due to excess calories without serious comorbidity with body mass index (BMI) of 35.0 to 35.9 in adult Low calorie/carb diet such as [employer].  Aerobic physical activities 5 times a week and weightlifting every 2 days recommended.  Jamie Mcknight, it was a pleasure seeing you today!  I will inform you of your results as soon as they are available.

## 2019-06-13 NOTE — Telephone Encounter (Signed)
Pap smear is final but result note needed recommendation. Please advise what follow up you recommend. Thanks

## 2019-06-14 NOTE — Telephone Encounter (Signed)
Handled through result note. Spoke with patient.

## 2019-06-18 LAB — PAP IG W/ RFLX HPV ASCU

## 2019-06-18 LAB — HPV TYPE 16 AND 18/45 RNA
HPV Type 16 RNA: NOT DETECTED
HPV Type 18/45 RNA: NOT DETECTED

## 2019-06-18 LAB — HUMAN PAPILLOMAVIRUS, HIGH RISK: HPV DNA High Risk: DETECTED — AB

## 2020-01-29 ENCOUNTER — Encounter (HOSPITAL_COMMUNITY): Payer: Self-pay

## 2020-09-04 ENCOUNTER — Encounter: Payer: BC Managed Care – PPO | Admitting: Obstetrics & Gynecology

## 2020-10-07 ENCOUNTER — Encounter: Payer: Self-pay | Admitting: Nurse Practitioner

## 2020-10-07 ENCOUNTER — Other Ambulatory Visit: Payer: Self-pay

## 2020-10-07 ENCOUNTER — Ambulatory Visit (INDEPENDENT_AMBULATORY_CARE_PROVIDER_SITE_OTHER): Payer: BC Managed Care – PPO | Admitting: Nurse Practitioner

## 2020-10-07 VITALS — BP 124/80 | Ht 63.0 in | Wt 234.0 lb

## 2020-10-07 DIAGNOSIS — Z3009 Encounter for other general counseling and advice on contraception: Secondary | ICD-10-CM

## 2020-10-07 DIAGNOSIS — B372 Candidiasis of skin and nail: Secondary | ICD-10-CM | POA: Diagnosis not present

## 2020-10-07 DIAGNOSIS — Z975 Presence of (intrauterine) contraceptive device: Secondary | ICD-10-CM | POA: Diagnosis not present

## 2020-10-07 DIAGNOSIS — Z01419 Encounter for gynecological examination (general) (routine) without abnormal findings: Secondary | ICD-10-CM | POA: Diagnosis not present

## 2020-10-07 MED ORDER — NYSTATIN 100000 UNIT/GM EX POWD: 1.0000 "application " | Freq: Three times a day (TID) | CUTANEOUS | 0 refills | Status: AC

## 2020-10-07 NOTE — Progress Notes (Signed)
   Jamie Mcknight 1986-02-12 [id number]   History:  34 y.o. G0 presents for annual exam. Nexplanon inserted 06/2017. She is due for removal and wants to discuss options. She does not want anything with estrogen and has tried COCs in the past with no improvement in heavy cycles. She does not want an IUD or Depo Provera. History of PCOS, bariatric surgery in 2018. 2020 pap ASCUS positive HR HPV negative 16/18/45. Colpo recommended but patient did not return due to financial situation. Redness under breasts and abdominal folds. She is out of Nystatin powder.   Gynecologic History No LMP recorded. Patient has had an implant.   Contraception: Nexplanon Last Pap: 05/2019. Results were: ASCUS positive HPV negative 16/18/45  Past medical history, past surgical history, family history and social history were all reviewed and documented in the EPIC chart.  ROS:  A ROS was performed and pertinent positives and negatives are included.  Exam:  Vitals:   10/07/20 1105  BP: 124/80  Weight: 234 lb (106.1 kg)  Height: 5\' 3"  (1.6 m)   Body mass index is 41.45 kg/m.  General appearance:  Normal Thyroid:  Symmetrical, normal in size, without palpable masses or nodularity. Respiratory  Auscultation:  Clear without wheezing or rhonchi Cardiovascular  Auscultation:  Regular rate, without rubs, murmurs or gallops  Edema/varicosities:  Not grossly evident Abdominal  Soft,nontender, without masses, guarding or rebound.  Liver/spleen:  No organomegaly noted  Hernia:  None appreciated  Skin  Inspection:  Redness under breasts and abdominal folds   Breasts: Examined lying and sitting.   Right: Without masses, retractions, discharge or axillary adenopathy.   Left: Without masses, retractions, discharge or axillary adenopathy. Gentitourinary   Inguinal/mons:  Normal without inguinal adenopathy  External genitalia:  Normal  BUS/Urethra/Skene's glands:  Normal  Vagina:  Normal  Cervix:   Normal  Uterus: Normal in size, shape and contour.  Midline and mobile  Adnexa/parametria:     Rt: Without masses or tenderness.   Lt: Without masses or tenderness.  Anus and perineum: Normal  Assessment/Plan:  34 y.o. G0 for annual exam.   Well female exam with routine gynecological exam - Education provided on SBEs, importance of preventative screenings, current guidelines, high calcium diet, regular exercise, and multivitamin daily. Labs with PCP.   Encounter for counseling regarding contraception - discussed PCOS/irregular heavy cycles and management with hormone manipulation. As mentioned above, she wants progestin-only and does not want IUD or Depo Provera. We both agree Nexplanon is the only option for her. We will check coverage and schedule removal and insertion with Dr. [id number].   Nexplanon in place - inserted 06/2017. Due for removal.   Skin yeast infection - Plan: nystatin (MYCOSTATIN/NYSTOP) powder applied 3 times per day as needed.  Screening for cervical cancer - 2020 pap ASCUS positive HR HPV negative 16/18/45. Colpo recommended but patient did not return due to financial situation. Pap today.   Follow up in 1 year for annual.     06-10-1974 [facility], 11:20 AM 10/07/2020

## 2020-10-07 NOTE — Patient Instructions (Signed)
Health Maintenance, Female Adopting a healthy lifestyle and getting preventive care are important in promoting health and wellness. Ask your health care provider about:  The right schedule for you to have regular tests and exams.  Things you can do on your own to prevent diseases and keep yourself healthy. What should I know about diet, weight, and exercise? Eat a healthy diet   Eat a diet that includes plenty of vegetables, fruits, low-fat dairy products, and lean protein.  Do not eat a lot of foods that are high in solid fats, added sugars, or sodium. Maintain a healthy weight Body mass index (BMI) is used to identify weight problems. It estimates body fat based on height and weight. Your health care provider can help determine your BMI and help you achieve or maintain a healthy weight. Get regular exercise Get regular exercise. This is one of the most important things you can do for your health. Most adults should:  Exercise for at least 150 minutes each week. The exercise should increase your heart rate and make you sweat (moderate-intensity exercise).  Do strengthening exercises at least twice a week. This is in addition to the moderate-intensity exercise.  Spend less time sitting. Even light physical activity can be beneficial. Watch cholesterol and blood lipids Have your blood tested for lipids and cholesterol at 32 years of age, then have this test every 5 years. Have your cholesterol levels checked more often if:  Your lipid or cholesterol levels are high.  You are older than 32 years of age.  You are at high risk for heart disease. What should I know about cancer screening? Depending on your health history and family history, you may need to have cancer screening at various ages. This may include screening for:  Breast cancer.  Cervical cancer.  Colorectal cancer.  Skin cancer.  Lung cancer. What should I know about heart disease, diabetes, and high blood  pressure? Blood pressure and heart disease  High blood pressure causes heart disease and increases the risk of stroke. This is more likely to develop in people who have high blood pressure readings, are of African descent, or are overweight.  Have your blood pressure checked: ? Every 3-5 years if you are 18-39 years of age. ? Every year if you are 40 years old or older. Diabetes Have regular diabetes screenings. This checks your fasting blood sugar level. Have the screening done:  Once every three years after age 40 if you are at a normal weight and have a low risk for diabetes.  More often and at a younger age if you are overweight or have a high risk for diabetes. What should I know about preventing infection? Hepatitis B If you have a higher risk for hepatitis B, you should be screened for this virus. Talk with your health care provider to find out if you are at risk for hepatitis B infection. Hepatitis C Testing is recommended for:  Everyone born from 1945 through 1965.  Anyone with known risk factors for hepatitis C. Sexually transmitted infections (STIs)  Get screened for STIs, including gonorrhea and chlamydia, if: ? You are sexually active and are younger than 32 years of age. ? You are older than 32 years of age and your health care provider tells you that you are at risk for this type of infection. ? Your sexual activity has changed since you were last screened, and you are at increased risk for chlamydia or gonorrhea. Ask your health care provider if   you are at risk.  Ask your health care provider about whether you are at high risk for HIV. Your health care provider may recommend a prescription medicine to help prevent HIV infection. If you choose to take medicine to prevent HIV, you should first get tested for HIV. You should then be tested every 3 months for as long as you are taking the medicine. Pregnancy  If you are about to stop having your period (premenopausal) and  you may become pregnant, seek counseling before you get pregnant.  Take 400 to 800 micrograms (mcg) of folic acid every day if you become pregnant.  Ask for birth control (contraception) if you want to prevent pregnancy. Osteoporosis and menopause Osteoporosis is a disease in which the bones lose minerals and strength with aging. This can result in bone fractures. If you are 65 years old or older, or if you are at risk for osteoporosis and fractures, ask your health care provider if you should:  Be screened for bone loss.  Take a calcium or vitamin D supplement to lower your risk of fractures.  Be given hormone replacement therapy (HRT) to treat symptoms of menopause. Follow these instructions at home: Lifestyle  Do not use any products that contain nicotine or tobacco, such as cigarettes, e-cigarettes, and chewing tobacco. If you need help quitting, ask your health care provider.  Do not use street drugs.  Do not share needles.  Ask your health care provider for help if you need support or information about quitting drugs. Alcohol use  Do not drink alcohol if: ? Your health care provider tells you not to drink. ? You are pregnant, may be pregnant, or are planning to become pregnant.  If you drink alcohol: ? Limit how much you use to 0-1 drink a day. ? Limit intake if you are breastfeeding.  Be aware of how much alcohol is in your drink. In the [place] one drink equals one 12 oz bottle of beer (355 mL), one 5 oz glass of wine (148 mL), or one 1 oz glass of hard liquor (44 mL). General instructions  Schedule regular health, dental, and eye exams.  Stay current with your vaccines.  Tell your health care provider if: ? You often feel depressed. ? You have ever been abused or do not feel safe at home. Summary  Adopting a healthy lifestyle and getting preventive care are important in promoting health and wellness.  Follow your health care provider's instructions about healthy  diet, exercising, and getting tested or screened for diseases.  Follow your health care provider's instructions on monitoring your cholesterol and blood pressure. This information is not intended to replace advice given to you by your health care provider. Make sure you discuss any questions you have with your health care provider. Document Revised: 10/04/2018 Document Reviewed: 10/04/2018 Elsevier Patient Education  2020 Elsevier Inc.  

## 2020-10-09 LAB — PAP IG W/ RFLX HPV ASCU

## 2020-10-13 ENCOUNTER — Ambulatory Visit (INDEPENDENT_AMBULATORY_CARE_PROVIDER_SITE_OTHER): Payer: BC Managed Care – PPO | Admitting: Obstetrics & Gynecology

## 2020-10-13 ENCOUNTER — Encounter: Payer: Self-pay | Admitting: Obstetrics & Gynecology

## 2020-10-13 ENCOUNTER — Other Ambulatory Visit: Payer: Self-pay

## 2020-10-13 VITALS — BP 140/90

## 2020-10-13 DIAGNOSIS — Z3046 Encounter for surveillance of implantable subdermal contraceptive: Secondary | ICD-10-CM

## 2020-10-13 NOTE — Progress Notes (Signed)
Jamie Mcknight Jan 17, 1986 [id number]        34 y.o.  G0P0000   RP: Removal and Insertion of Nexplanon  HPI: Time to remove the Nexplanon after 3 years and insert a new one.  Well on Nexplanon.  No BTB.  No pelvic pain.   OB History  Gravida Para Term Preterm AB Living  0 0 0 0 0 0  SAB IAB Ectopic Multiple Live Births  0 0 0 0 0    Past medical history,surgical history, problem list, medications, allergies, family history and social history were all reviewed and documented in the EPIC chart.   Directed ROS with pertinent positives and negatives documented in the history of present illness/assessment and plan.  Exam:  Vitals:   10/13/20 1036  BP: 140/90   General appearance:  Normal                                                             Nexplanon procedure note (removal)  The patient presented to the office today requesting for removal of her Nexplanon that was placed in the year 06/2017 on her left arm.   On examination the nexplanon implant was palpated and the distal end  (end  closest to the elbow) was marked. The area was sterilized with Betadine solution. 1% lidocaine was used for local anesthesia and approximately 1 cc  was injected into the site that was marked where the incision was to be made. The local anesthetic was injected under the implant in an effort to keep it  close to the skin surface. Slight pressure pushing downward was made at the proximal end  of the implant in an effort to stabilize it. A bulge appeared indicating the distal end of the implant. A small transverse incision of 2 mm was made at that location. By gently pushing the implant toward the incision, the tip became visible. Grasping the implant with a curved forcep facilitated in gently removing the implant. Full confirmation of the entire implant which is 4 cm long was inspected and was intact and was shown to the patient and discarded. After removing the implant, the incision was closed with  3Steri-Strips, a band-aid and a bandage. Patient will be instructed to remove the pressure bandage in 24 hours, the band-aid in 3 days and the Steri-Strips in 7 days.                                              Nexplanon Procedure Note (insertion)   The patient was laying on her back with her nondominant arm flexed at the elbow and externally rotated. The insertion site was identified as the underside of the nondominant upper arm approximately 8 cm from the medial epicondyle of the humerus.  A mark was made with a sterile marker at the spot where the Nexplanon implant will be inserted. The area was cleansed with Betadine solution. The area was anesthetized with 1% lidocaine  (1 cc)  at the area the injection site and underneath the skin along the planned insertion tunnel. The preloaded disposable Nexplanon was removed from its sterile casing.  The applicator was held above the needle  at the textured surface area. The transparent protector was removed. With a freehand, the skin was stretched around the insertion site with a thumb and index finger. The skin was then punctured with the tip of the needle angled at 30. The Nexplanon applicator was lowered to a horizontal position. While lifting the skin with the tip of the needle the needle was then slid to its full length. The applicator was kept in this position with the needle inserted to its full length. The purple slider was unlocked by pushing it slightly downward. The slider was fully moved back until it stopped. This allowed the implant to be in the final subdermal position and the needle to be locked inside the body of the applicator. The applicator was then removed. 3 Steri-Strips were applied over the incision, a band-aid and a bandage was placed which the patient is to remove tomorrow. No complications, the patient tolerated procedure well and was released home with instructions.   Assessment/Plan:  34 y.o. G0P0000   1. Encounter for removal and  reinsertion of Nexplanon Well on Nexplanon for 3 years.  Easy removal of Nexplanon without complication.  Easy insertion of the new Nexplanon without complication.  Well-tolerated by patient.  Postprocedure precautions reviewed.  Genia Del MD, 10:58 AM 10/13/2020

## 2020-10-16 ENCOUNTER — Encounter: Payer: Self-pay | Admitting: Anesthesiology

## 2021-01-28 ENCOUNTER — Encounter (HOSPITAL_COMMUNITY): Payer: Self-pay | Admitting: *Deleted

## 2021-10-08 ENCOUNTER — Other Ambulatory Visit (HOSPITAL_COMMUNITY)
Admission: RE | Admit: 2021-10-08 | Discharge: 2021-10-08 | Disposition: A | Discharge status: Home / Self Care | Payer: BC Managed Care – PPO | Source: Ambulatory Visit | Attending: Nurse Practitioner | Admitting: Nurse Practitioner

## 2021-10-08 ENCOUNTER — Ambulatory Visit (INDEPENDENT_AMBULATORY_CARE_PROVIDER_SITE_OTHER): Payer: BC Managed Care – PPO | Admitting: Nurse Practitioner

## 2021-10-08 ENCOUNTER — Encounter: Payer: Self-pay | Admitting: Nurse Practitioner

## 2021-10-08 ENCOUNTER — Other Ambulatory Visit: Payer: Self-pay

## 2021-10-08 VITALS — BP 126/80 | HR 84 | Resp 99 | Ht 64.5 in | Wt 240.4 lb

## 2021-10-08 DIAGNOSIS — Z01419 Encounter for gynecological examination (general) (routine) without abnormal findings: Secondary | ICD-10-CM

## 2021-10-08 DIAGNOSIS — Z3009 Encounter for other general counseling and advice on contraception: Secondary | ICD-10-CM

## 2021-10-08 DIAGNOSIS — Z7689 Persons encountering health services in other specified circumstances: Secondary | ICD-10-CM | POA: Diagnosis not present

## 2021-10-08 DIAGNOSIS — Z3046 Encounter for surveillance of implantable subdermal contraceptive: Secondary | ICD-10-CM

## 2021-10-08 NOTE — Progress Notes (Signed)
Jamie Mcknight Jun 02, 1986 [id number]   History:  35 y.o. G0 presents for annual exam. Nexplanon 09/2020. She wants to discuss switching to different contraceptive method. She is struggling with weight gain despite no changes in diet and exercise and worsening PMS symptoms. She has irregular periods with severe cramping. History of PCOS, bariatric surgery in 2018. 2020 pap ASCUS positive HR HPV negative 16/18/45, normal 09/2020.  Gynecologic History Patient's last menstrual period was 10/06/2021 (exact date). Period Pattern: (!) Irregular Menstrual Flow:  (varies) Menstrual Control: Maxi pad, Tampon Menstrual Control Change Freq (Hours): 3 Dysmenorrhea: (!) Severe Dysmenorrhea Symptoms: Other (Comment), Cramping, Headache, Diarrhea (fatigue) Contraception/Family planning: Nexplanon Sexually active: Yes  Health Maintenance Last Pap: 10/07/2020. Results were: Normal Last mammogram: Not indicated Last colonoscopy: Not indicated Last Dexa: Not indicated  Past medical history, past surgical history, family history and social history were all reviewed and documented in the EPIC chart. Married. 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   ROS:  A ROS was performed and pertinent positives and negatives are included.  Exam:  Vitals:   10/08/21 0854  BP: 126/80  Pulse: 84  Resp: (!) 99  Weight: 240 lb 6.4 oz (109 kg)  Height: 5' 4.5" (1.638 m)    Body mass index is 40.63 kg/m.  General appearance:  Normal Thyroid:  Symmetrical, normal in size, without palpable masses or nodularity. Respiratory  Auscultation:  Clear without wheezing or rhonchi Cardiovascular  Auscultation:  Regular rate, without rubs, murmurs or gallops  Edema/varicosities:  Not grossly evident Abdominal  Soft,nontender, without masses, guarding or rebound.  Liver/spleen:  No organomegaly noted  Hernia:  None appreciated  Skin  Inspection:  Redness under breasts and abdominal folds   Breasts: Examined lying and  sitting.   Right: Without masses, retractions, discharge or axillary adenopathy.   Left: Without masses, retractions, discharge or axillary adenopathy. Genitourinary   Inguinal/mons:  Normal without inguinal adenopathy  External genitalia:  Normal appearing vulva with no masses, tenderness, or lesions  BUS/Urethra/Skene's glands:  Normal  Vagina:  Normal appearing with normal color and discharge, no lesions  Cervix:  Normal appearing without discharge or lesions  Uterus:  Difficult to palpate due to body habitus but no gross masses or tenderness  Adnexa/parametria:     Rt: Normal in size, without masses or tenderness.   Lt: Normal in size, without masses or tenderness.  Anus and perineum: Normal  Patient informed chaperone available to be present for breast and pelvic exam. Patient has requested no chaperone to be present. Patient has been advised what will be completed during breast and pelvic exam.   Assessment/Plan:  34 y.o. G0 for annual exam.   Well female exam with routine gynecological exam - Plan: Cytology - PAP( [facility]). Education provided on SBEs, importance of preventative screenings, current guidelines, high calcium diet, regular exercise, and multivitamin daily.  Labs with PCP.  Encounter for surveillance of implantable subdermal contraceptive - Nexplanon inserted 09/2020, this is her second one. She has been experiencing weight gain, PMS symptoms, and worsening cramps even months where she does not bleed.    Encounter for other general counseling and advice on contraception - Contraceptive options were reviewed, including hormonal methods, both combination (pill, patch, vaginal ring) and progesterone-only (pill, Depo Provera and Nexplanon), intrauterine devices (Mirena, [place], [place], and [place]), barrier methods (condoms, diaphragm) and female/female sterilization. The mechanisms, risks, benefits and side effects of all methods were discussed. She has been on OCPs in the  past and tolerated well.  She would like to restart these. We also discussed the option to take continuously if PMS symptoms and cramping are still bothersome. She will schedule appointment for removal.   Encounter for weight management - Recommend increasing exercise to make sure she is having calorie deficits each day. I also recommend she follow up with bariatric clinic for further guidance on weight management.   Screening for cervical cancer - 2020 pap ASCUS positive HR HPV negative 16/18/45. Normal pap 09/2020. Pap with HR HPV today per guidelines.   Follow up in 1 year for annual.     Jamie Mcknight, 10:06 AM 10/08/2021

## 2021-10-09 LAB — CYTOLOGY - PAP
Comment: NEGATIVE
Diagnosis: NEGATIVE
High risk HPV: NEGATIVE

## 2021-10-16 ENCOUNTER — Other Ambulatory Visit: Payer: Self-pay

## 2021-10-16 ENCOUNTER — Ambulatory Visit
Admission: RE | Admit: 2021-10-16 | Discharge: 2021-10-16 | Disposition: A | Discharge status: Home / Self Care | Payer: BC Managed Care – PPO | Source: Ambulatory Visit | Attending: Family Medicine | Admitting: Family Medicine

## 2021-10-16 ENCOUNTER — Other Ambulatory Visit: Payer: Self-pay | Admitting: Family Medicine

## 2021-10-16 ENCOUNTER — Ambulatory Visit
Admission: RE | Admit: 2021-10-16 | Discharge: 2021-10-16 | Disposition: A | Discharge status: Home / Self Care | Payer: BC Managed Care – PPO | Attending: Family Medicine | Admitting: Family Medicine

## 2021-10-16 DIAGNOSIS — M25531 Pain in right wrist: Secondary | ICD-10-CM | POA: Insufficient documentation

## 2021-11-19 ENCOUNTER — Encounter: Payer: Self-pay | Admitting: Nurse Practitioner

## 2021-11-19 ENCOUNTER — Ambulatory Visit (INDEPENDENT_AMBULATORY_CARE_PROVIDER_SITE_OTHER): Payer: BC Managed Care – PPO | Admitting: Nurse Practitioner

## 2021-11-19 ENCOUNTER — Other Ambulatory Visit: Payer: Self-pay

## 2021-11-19 VITALS — BP 122/80

## 2021-11-19 DIAGNOSIS — Z3009 Encounter for other general counseling and advice on contraception: Secondary | ICD-10-CM

## 2021-11-19 NOTE — Progress Notes (Signed)
° °  Acute Office Visit  Subjective:    Patient ID: Jamie Mcknight, female    DOB: 1985-11-04, 36 y.o.   MRN: [id number]   HPI 36 y.o. presents today for Nexplanon removal due to irregular bleeding, dysmenorrhea, weight gain, and worsening PMS symptoms. She was planning to switch to OCPs since she has been on them in the past but after discussion regarding malabsorption following bariatric surgery and birth control pills, it is recommended she consider different method. She has a friend who has been using natural supplements for PCOS and appetite suppression and she wants to try these first.    Review of Systems  Constitutional: Negative.       Objective:    Physical Exam Constitutional:      Appearance: Normal appearance.    BP 122/80  Wt Readings from Last 3 Encounters:  10/08/21 240 lb 6.4 oz (109 kg)  10/07/20 234 lb (106.1 kg)  06/06/19 199 lb (90.3 kg)        Assessment & Plan:   Problem List Items Addressed This Visit   None Visit Diagnoses     Encounter for other general counseling and advice on contraception    -  Primary      Plan: Contraceptive options were reviewed, including hormonal methods, both combination (pill, patch, vaginal ring) and progesterone-only (pill, Depo Provera and Nexplanon), intrauterine devices (Mirena, [place], [place], and [place]), barrier methods (condoms, diaphragm) and female/female Solicitor. The mechanisms, risks, benefits and side effects of all methods were discussed.   She has decided to keep Nexplanon in place for now but is considering IUD. She wants to try natural supplements to help with some of the symptoms she is experiencing. She plans to return in 3-6 months for Nexplanon removal and IUD placement if no improvement.      Tamela Gammon DNP, 11:34 AM 11/19/2021

## 2021-12-31 IMAGING — CR DG WRIST COMPLETE 3+V*R*
4 series · 4 of 4 positions shown · non-contrast
Comparison: None.

CLINICAL DATA: Right wrist pain for 2 weeks, initial encounter

EXAM:
RIGHT WRIST - COMPLETE 3+ VIEW

[wrist pa]
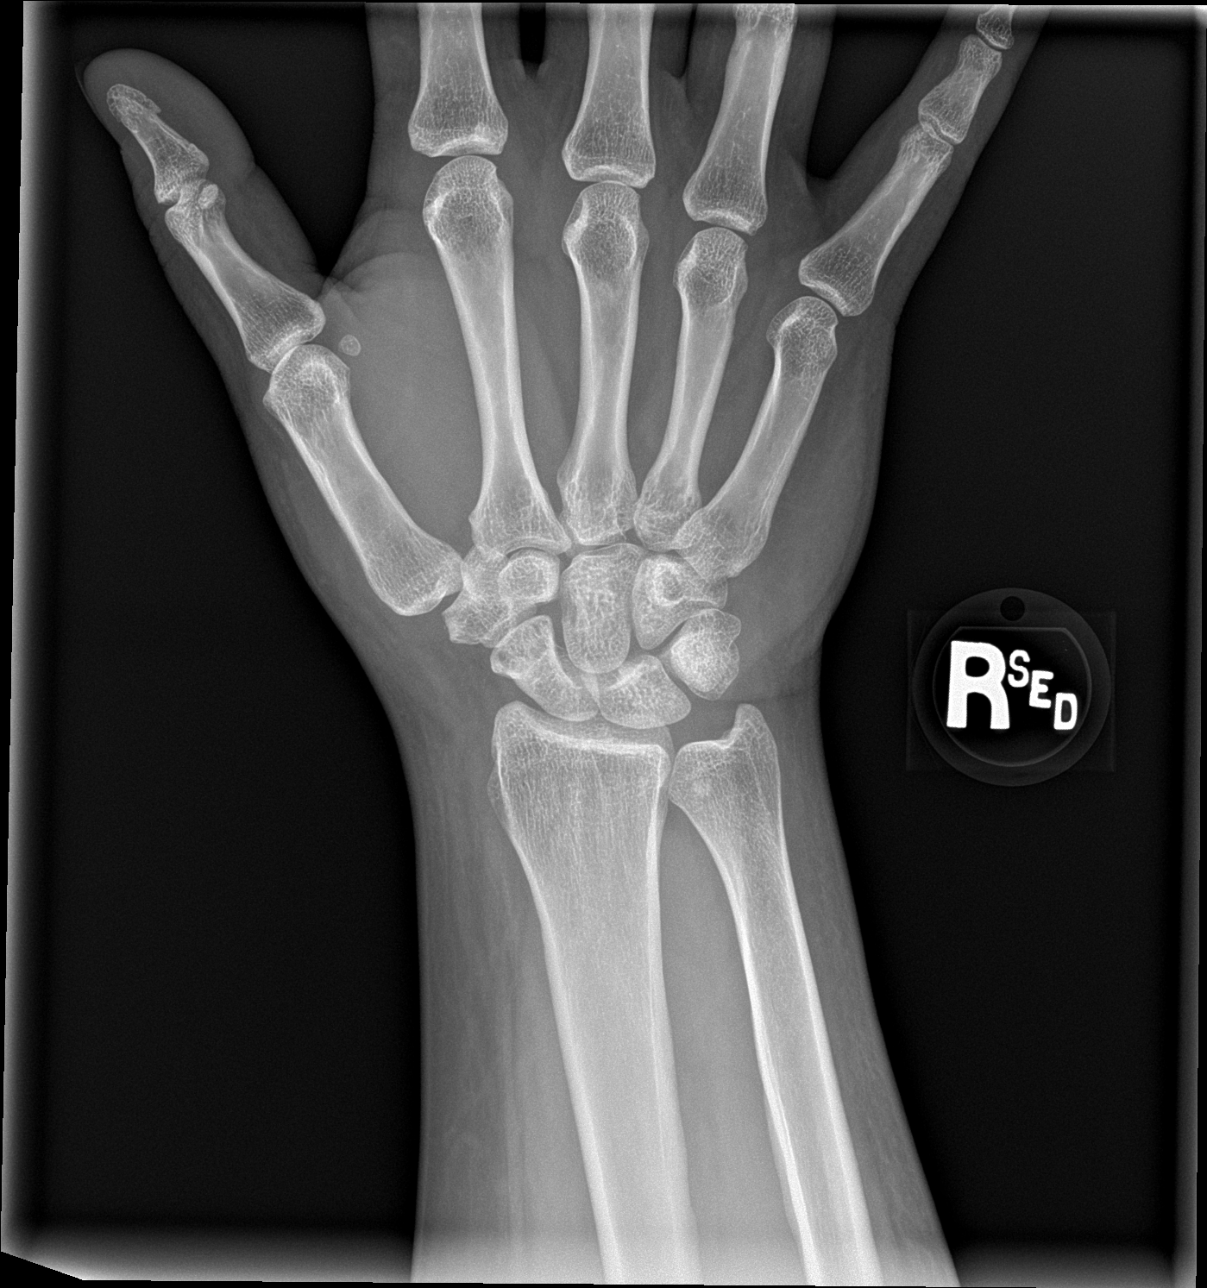

[wrist obl]
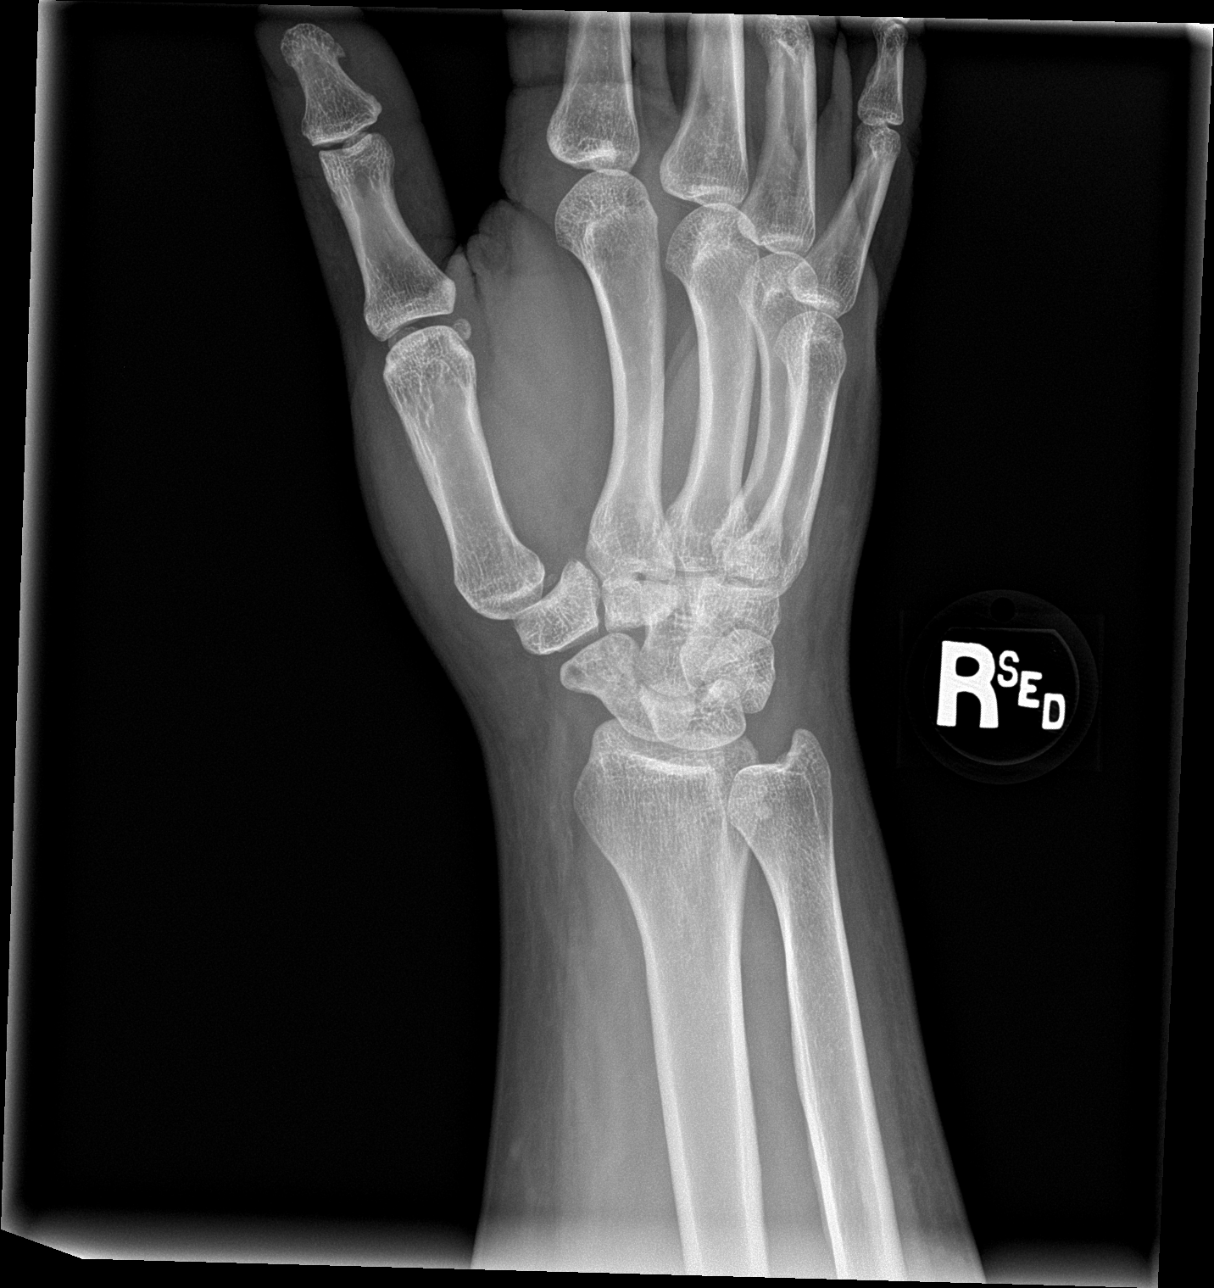

[wrist lat]
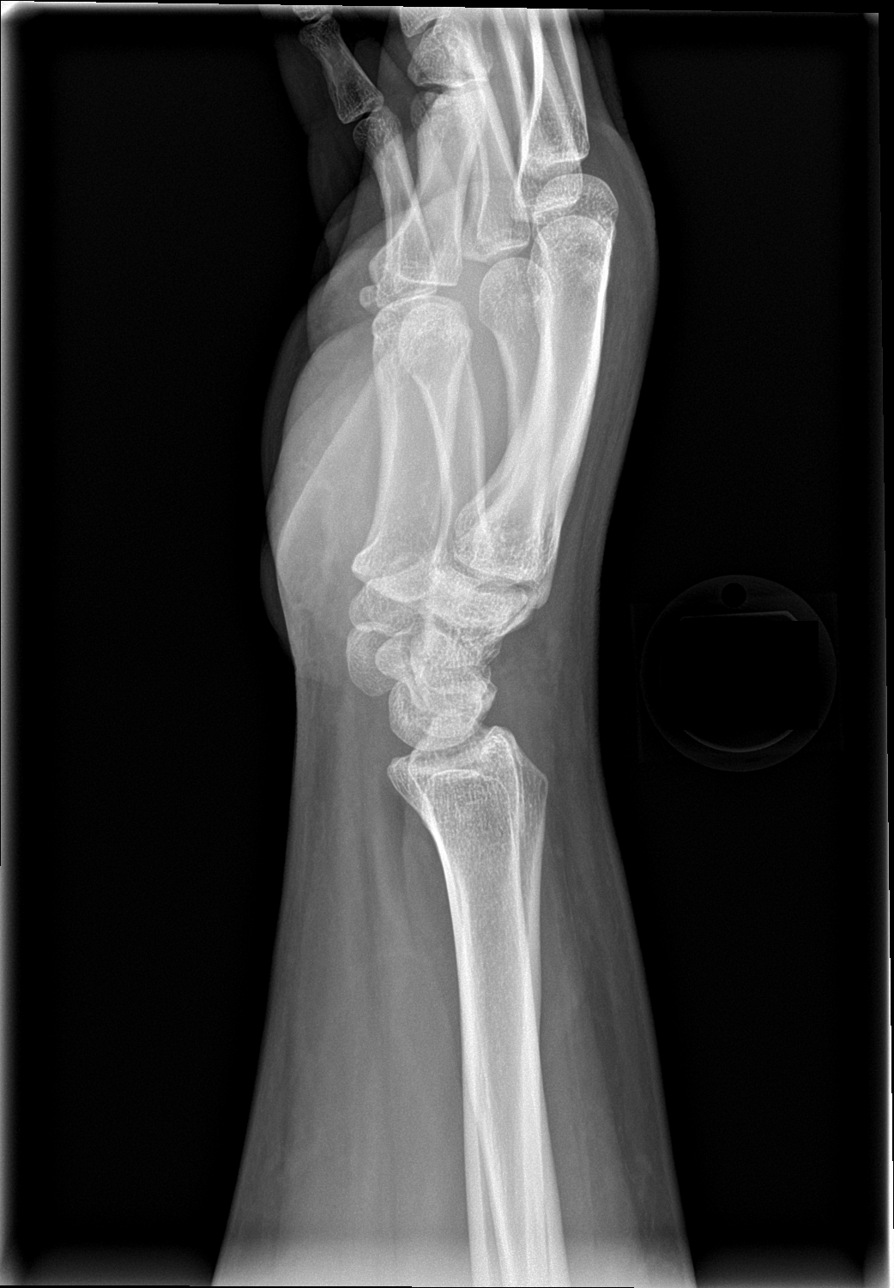

[wrist navicular]
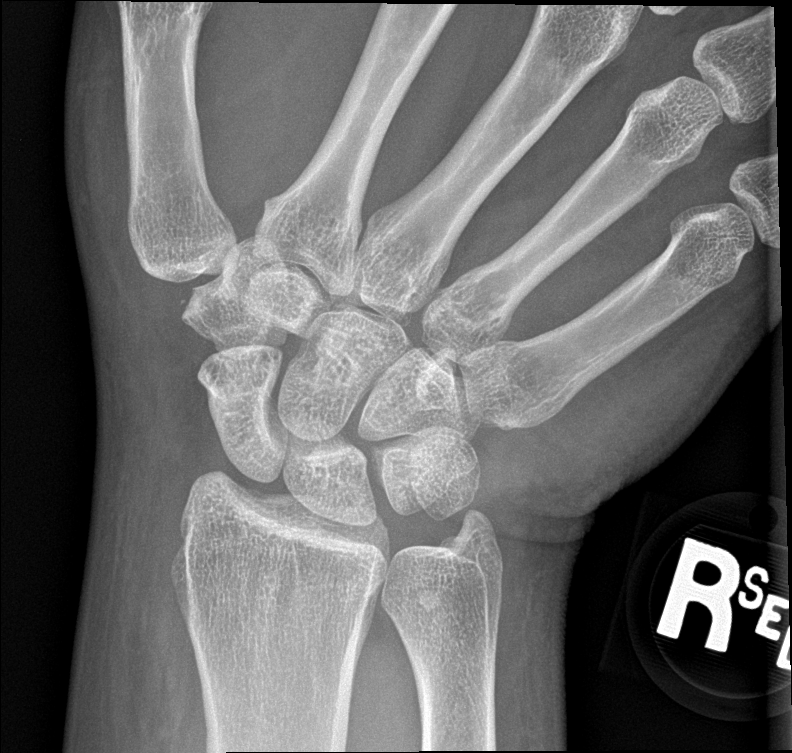

[4 of 4 positions shown; findings below may reference images not displayed]

FINDINGS: A tiny bony density is noted adjacent to the trapezium at the level
of the first CMC joint consistent with small avulsion. This is best
seen on the scaphoid view. No soft tissue abnormality is noted. No
other focal abnormality is seen.
IMPRESSION: Small avulsion from the trapezium as described.

## 2022-02-05 ENCOUNTER — Encounter (HOSPITAL_COMMUNITY): Payer: Self-pay | Admitting: *Deleted

## 2022-04-02 ENCOUNTER — Other Ambulatory Visit: Payer: Self-pay

## 2022-04-02 DIAGNOSIS — Z30430 Encounter for insertion of intrauterine contraceptive device: Secondary | ICD-10-CM

## 2022-04-05 ENCOUNTER — Encounter: Payer: Self-pay | Admitting: Nurse Practitioner

## 2022-04-05 ENCOUNTER — Ambulatory Visit (INDEPENDENT_AMBULATORY_CARE_PROVIDER_SITE_OTHER): Payer: BC Managed Care – PPO | Admitting: Nurse Practitioner

## 2022-04-05 VITALS — BP 170/112

## 2022-04-05 DIAGNOSIS — Z3046 Encounter for surveillance of implantable subdermal contraceptive: Secondary | ICD-10-CM | POA: Diagnosis not present

## 2022-04-05 DIAGNOSIS — Z30430 Encounter for insertion of intrauterine contraceptive device: Secondary | ICD-10-CM

## 2022-04-05 NOTE — Progress Notes (Signed)
   Jamie Mcknight [birth year] [id number]   History:  36 y.o. G0 presents for removal of Nexplanon and insertion of Mirena IUD.  Pt has been counseled about risks and benefits as well as complications.  Consent is obtained today.  No LMP recorded. Patient has had an implant. STD testing: None  Past medical history, past surgical history, family history and social history were all reviewed and documented in the EPIC chart.  ROS:  A ROS was performed and pertinent positives and negatives are included.  Exam: Vitals:   04/05/22 1401  BP: (!) 170/112   There is no height or weight on file to calculate BMI.  Pelvic exam: Vulva:  normal female genitalia Vagina:  normal vagina, no discharge, exudate, lesion, or erythema Cervix:  Non-tender, Negative CMT, no lesions or redness. Uterus:  normal shape, position and consistency    Procedure:  Speculum inserted.   Cervix visualized and cleansed with Betadine x 3.  Tenaculum placed on anterior cervix. Then uterus sounded to 8.5 cm. IUD inserted easily. Strings trimmed to 3 cm. Minimal bleeding noted.  Pt tolerated the procedure well.  Procedure: Patient placed supine on exam table with her left arm flexed at the elbow. The prior insertion site was located and the Nexplanon rod was palpated.  Area cleansed with Betadine x 3 and draped in normal sterile fashion.  Insertion site and surrounding tissue anesthetized with 1% Lidocaine without epinephrine, 2cc total used.  Small incision made with #11 blade.  Nexplanon removed without difficulty.  Steri-strips were applied and pressure dressing placed over the site.  Entire procedure performed with sterile technique.  Pt tolerated procedure well.  Chaperone present: Kennon Portela, CMA   Assessment/Plan:  Insertion of Mirena IUD   Return for recheck 4-6 weeks Pt aware to call for any concerns Pt aware removal due no later than 8 years from insertion date, IUD card given to pt. Elevated blood pressure  reading - will monitor and follow up with PCP   [name] DNP, 2:52 PM 04/05/2022

## 2022-05-06 ENCOUNTER — Ambulatory Visit: Payer: BC Managed Care – PPO | Admitting: Nurse Practitioner

## 2022-10-11 ENCOUNTER — Ambulatory Visit: Payer: BC Managed Care – PPO | Admitting: Nurse Practitioner

## 2022-10-29 ENCOUNTER — Ambulatory Visit: Payer: BC Managed Care – PPO | Admitting: Nurse Practitioner

## 2023-02-10 ENCOUNTER — Encounter (HOSPITAL_COMMUNITY): Payer: Self-pay | Admitting: *Deleted

## 2024-02-08 ENCOUNTER — Encounter (HOSPITAL_COMMUNITY): Payer: Self-pay | Admitting: *Deleted
